# Patient Record
Sex: Female | Born: 1970 | Hispanic: No | Marital: Single | Smoking: Never smoker
Health system: Southern US, Community
[De-identification: ages and names within clinical notes are randomized; demographics above are authoritative.]

## PROBLEM LIST (undated history)

## (undated) DIAGNOSIS — I341 Nonrheumatic mitral (valve) prolapse: Secondary | ICD-10-CM

## (undated) DIAGNOSIS — F411 Generalized anxiety disorder: Secondary | ICD-10-CM

## (undated) DIAGNOSIS — N6019 Diffuse cystic mastopathy of unspecified breast: Secondary | ICD-10-CM

## (undated) DIAGNOSIS — J302 Other seasonal allergic rhinitis: Secondary | ICD-10-CM

## (undated) DIAGNOSIS — M419 Scoliosis, unspecified: Secondary | ICD-10-CM

## (undated) HISTORY — PX: WISDOM TOOTH EXTRACTION: SHX21

## (undated) HISTORY — DX: Other seasonal allergic rhinitis: J30.2

## (undated) HISTORY — DX: Scoliosis, unspecified: M41.9

## (undated) HISTORY — DX: Diffuse cystic mastopathy of unspecified breast: N60.19

## (undated) HISTORY — DX: Generalized anxiety disorder: F41.1

---

## 1997-07-18 ENCOUNTER — Other Ambulatory Visit: Admission: RE | Admit: 1997-07-18 | Discharge: 1997-07-18 | Payer: Self-pay | Admitting: *Deleted

## 1998-05-15 ENCOUNTER — Other Ambulatory Visit: Admission: RE | Admit: 1998-05-15 | Discharge: 1998-05-15 | Payer: Self-pay | Admitting: Obstetrics and Gynecology

## 2000-05-26 ENCOUNTER — Other Ambulatory Visit: Admission: RE | Admit: 2000-05-26 | Discharge: 2000-05-26 | Payer: Self-pay | Admitting: Obstetrics and Gynecology

## 2001-06-21 ENCOUNTER — Other Ambulatory Visit: Admission: RE | Admit: 2001-06-21 | Discharge: 2001-06-21 | Payer: Self-pay | Admitting: Obstetrics and Gynecology

## 2003-03-11 ENCOUNTER — Encounter: Admission: RE | Admit: 2003-03-11 | Discharge: 2003-03-11 | Payer: Self-pay | Admitting: *Deleted

## 2004-04-20 ENCOUNTER — Encounter: Admission: RE | Admit: 2004-04-20 | Discharge: 2004-04-20 | Payer: Self-pay | Admitting: Obstetrics and Gynecology

## 2004-11-19 ENCOUNTER — Encounter: Admission: RE | Admit: 2004-11-19 | Discharge: 2004-11-19 | Payer: Self-pay | Admitting: Obstetrics and Gynecology

## 2007-04-06 ENCOUNTER — Encounter: Admission: RE | Admit: 2007-04-06 | Discharge: 2007-04-06 | Payer: Self-pay | Admitting: Obstetrics

## 2008-02-21 ENCOUNTER — Encounter: Admission: RE | Admit: 2008-02-21 | Discharge: 2008-02-21 | Payer: Self-pay | Admitting: Obstetrics

## 2008-08-07 ENCOUNTER — Encounter: Admission: RE | Admit: 2008-08-07 | Discharge: 2008-08-07 | Payer: Self-pay | Admitting: Obstetrics

## 2009-02-26 ENCOUNTER — Encounter: Admission: RE | Admit: 2009-02-26 | Discharge: 2009-02-26 | Payer: Self-pay | Admitting: Obstetrics

## 2009-06-23 ENCOUNTER — Encounter: Admission: RE | Admit: 2009-06-23 | Discharge: 2009-06-23 | Payer: Self-pay | Admitting: Obstetrics

## 2009-07-22 ENCOUNTER — Encounter: Admission: RE | Admit: 2009-07-22 | Discharge: 2009-07-22 | Payer: Self-pay | Admitting: Obstetrics

## 2009-07-22 HISTORY — PX: BREAST CYST ASPIRATION: SHX578

## 2009-12-25 ENCOUNTER — Encounter
Admission: RE | Admit: 2009-12-25 | Discharge: 2009-12-25 | Payer: Self-pay | Source: Home / Self Care | Attending: Obstetrics | Admitting: Obstetrics

## 2009-12-25 HISTORY — PX: BREAST CYST ASPIRATION: SHX578

## 2010-08-09 ENCOUNTER — Other Ambulatory Visit: Payer: Self-pay | Admitting: Obstetrics

## 2010-08-09 ENCOUNTER — Other Ambulatory Visit: Payer: Self-pay | Admitting: Obstetrics and Gynecology

## 2010-08-09 DIAGNOSIS — N644 Mastodynia: Secondary | ICD-10-CM

## 2010-08-09 DIAGNOSIS — N6009 Solitary cyst of unspecified breast: Secondary | ICD-10-CM

## 2010-08-16 ENCOUNTER — Ambulatory Visit
Admission: RE | Admit: 2010-08-16 | Discharge: 2010-08-16 | Disposition: A | Discharge status: Home / Self Care | Payer: Commercial Managed Care - PPO | Source: Ambulatory Visit | Attending: Obstetrics | Admitting: Obstetrics

## 2010-08-16 ENCOUNTER — Other Ambulatory Visit: Payer: Self-pay | Admitting: Obstetrics

## 2010-08-16 DIAGNOSIS — N6009 Solitary cyst of unspecified breast: Secondary | ICD-10-CM

## 2010-08-16 DIAGNOSIS — N644 Mastodynia: Secondary | ICD-10-CM

## 2010-08-16 HISTORY — PX: BREAST CYST ASPIRATION: SHX578

## 2011-03-18 ENCOUNTER — Ambulatory Visit (INDEPENDENT_AMBULATORY_CARE_PROVIDER_SITE_OTHER): Payer: Commercial Indemnity | Admitting: Surgery

## 2011-03-18 ENCOUNTER — Encounter (INDEPENDENT_AMBULATORY_CARE_PROVIDER_SITE_OTHER): Payer: Self-pay | Admitting: Surgery

## 2011-03-18 VITALS — BP 102/74 | HR 76 | Temp 98.6°F | Resp 18 | Ht 65.0 in | Wt 133.4 lb

## 2011-03-18 DIAGNOSIS — L02214 Cutaneous abscess of groin: Secondary | ICD-10-CM

## 2011-03-18 DIAGNOSIS — L03319 Cellulitis of trunk, unspecified: Secondary | ICD-10-CM

## 2011-03-18 DIAGNOSIS — L02219 Cutaneous abscess of trunk, unspecified: Secondary | ICD-10-CM

## 2011-03-18 NOTE — Progress Notes (Signed)
[facility]  Ovidio Kin, MD,  FACS [address]  [address] [place], [place]    [address] Phone:  [phone] FAX:  [phone]   Re:   Cynthia Nguyen DOB:   [birth year] MRN:   [id number]  ASSESSMENT AND PLAN: 1.  Left Groin abscess.  She had infection there 20 years ago.  Has had recurrent drainage for about 4 months.  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  Plan excision under local anesthesia.  Indications and risks explained to the patient. (note: she has a mole beside the cyst that I will also excise.)  2.  Scoliosis. 3.  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  HISTORY OF PRESENT ILLNESS: Chief Complaint  Patient presents with  . Cyst    lt groin    Cynthia Nguyen is a 41 y.o. (DOB: September 29, 1970)  white female who is a patient of Elie Confer, MD, MD and comes to me today for groin abscess.  Referred by Dr. Darrold Span.  Works as a Education officer, community.  She had infection there 20 years ago.  Has had recurrent drainage for about 4 months.  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  Has tried some antibiotics which has helped some.  Allergies:  Cephalexin PMHx:  Scoliosis.  MVP  Social Hx:  Cynthia Nguyen with her.  She works as a Education officer, community.  PHYSICAL EXAM: BP 102/74  Pulse 76  Temp(Src) 98.6 F (37 C) (Oral)  Resp 18  Ht 5\' 5"  (1.651 m)  Wt 133 lb 6 oz (60.499 kg)  BMI 22.19 kg/m2  Left groin - 1 cm draining cyst.  Small mole just beside this that I would excise at the same time.  DATA REVIEWED: Notes from Dr. Milus Banister, MD, FACS Office:  [phone]

## 2011-03-21 ENCOUNTER — Telehealth (INDEPENDENT_AMBULATORY_CARE_PROVIDER_SITE_OTHER): Payer: Self-pay

## 2011-03-21 NOTE — Telephone Encounter (Signed)
We received a voice message from the pt that the cyst has gone away and looks it better.  She wants to know if it's possible for the cyst to completely go away.  She can't have surgery this Friday due to her work schedule and would like to postpone a few weeks.  I left her a message back that I don't think the cyst  goes away but the inflammation and infection does and there is still a cyst sac that needs removing.  Eunice Blase will talk to her about rescheduling.

## 2011-03-22 ENCOUNTER — Telehealth (INDEPENDENT_AMBULATORY_CARE_PROVIDER_SITE_OTHER): Payer: Self-pay

## 2011-03-22 NOTE — Telephone Encounter (Signed)
Cynthia [facility] from [facility] called. No labs or tests were ordered on orders sheet that was axed over. If patient needs please fax them over

## 2011-03-23 ENCOUNTER — Telehealth (INDEPENDENT_AMBULATORY_CARE_PROVIDER_SITE_OTHER): Payer: Self-pay | Admitting: General Surgery

## 2011-03-23 NOTE — Telephone Encounter (Signed)
Called Brenda at the [facility] to let them know that Dr Ezzard Standing does not need any pre op labs on this pt day of surgery

## 2011-09-08 ENCOUNTER — Other Ambulatory Visit: Payer: Self-pay | Admitting: Obstetrics

## 2011-09-08 DIAGNOSIS — Z1231 Encounter for screening mammogram for malignant neoplasm of breast: Secondary | ICD-10-CM

## 2011-09-26 ENCOUNTER — Ambulatory Visit
Admission: RE | Admit: 2011-09-26 | Discharge: 2011-09-26 | Disposition: A | Discharge status: Home / Self Care | Payer: Commercial Indemnity | Source: Ambulatory Visit | Attending: Obstetrics | Admitting: Obstetrics

## 2011-09-26 DIAGNOSIS — Z1231 Encounter for screening mammogram for malignant neoplasm of breast: Secondary | ICD-10-CM

## 2012-08-13 ENCOUNTER — Other Ambulatory Visit: Payer: Self-pay

## 2012-08-13 DIAGNOSIS — Z1231 Encounter for screening mammogram for malignant neoplasm of breast: Secondary | ICD-10-CM

## 2012-10-08 ENCOUNTER — Ambulatory Visit: Payer: Commercial Indemnity

## 2012-10-08 ENCOUNTER — Other Ambulatory Visit: Payer: Self-pay | Admitting: Obstetrics

## 2012-10-08 DIAGNOSIS — N6009 Solitary cyst of unspecified breast: Secondary | ICD-10-CM

## 2012-10-26 ENCOUNTER — Ambulatory Visit
Admission: RE | Admit: 2012-10-26 | Discharge: 2012-10-26 | Disposition: A | Discharge status: Home / Self Care | Payer: Managed Care, Other (non HMO) | Source: Ambulatory Visit | Attending: Obstetrics | Admitting: Obstetrics

## 2012-10-26 DIAGNOSIS — N6009 Solitary cyst of unspecified breast: Secondary | ICD-10-CM

## 2012-10-29 ENCOUNTER — Encounter: Payer: Self-pay | Admitting: Podiatry

## 2012-10-29 ENCOUNTER — Ambulatory Visit (INDEPENDENT_AMBULATORY_CARE_PROVIDER_SITE_OTHER): Payer: Managed Care, Other (non HMO) | Admitting: Podiatry

## 2012-10-29 VITALS — BP 117/82 | HR 84 | Resp 12 | Ht 65.0 in | Wt 136.0 lb

## 2012-10-29 DIAGNOSIS — B351 Tinea unguium: Secondary | ICD-10-CM

## 2012-10-29 DIAGNOSIS — M7750 Other enthesopathy of unspecified foot: Secondary | ICD-10-CM

## 2012-10-29 MED ORDER — TERBINAFINE HCL 250 MG PO TABS: 250.0000 mg | ORAL_TABLET | Freq: Every day | ORAL | Status: DC

## 2012-10-29 NOTE — Progress Notes (Signed)
Subjective:     Patient ID: Cynthia Nguyen, female   DOB: 1970-03-04, 42 y.o.   MRN: [id number]  HPI patient states I am doing okay but the fungus on my toenail seems worse. States that she likes her orthotics and is walking well with   Review of Systems  All other systems reviewed and are negative.       Objective:   Physical Exam  Nursing note and vitals reviewed. Cardiovascular: Intact distal pulses.   Musculoskeletal: Normal range of motion.  Neurological: She is alert.  Skin: Skin is warm.   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

## 2012-10-29 NOTE — Patient Instructions (Signed)
Continue topical and begin 30 days of oral

## 2012-12-10 ENCOUNTER — Encounter: Payer: Self-pay | Admitting: Podiatry

## 2012-12-10 ENCOUNTER — Ambulatory Visit: Payer: Managed Care, Other (non HMO) | Admitting: Podiatry

## 2012-12-10 VITALS — BP 110/75 | HR 81 | Resp 16

## 2012-12-10 DIAGNOSIS — B351 Tinea unguium: Secondary | ICD-10-CM

## 2012-12-10 NOTE — Progress Notes (Signed)
Subjective:     Patient ID: Cynthia Nguyen, female   DOB: 1970/12/04, 42 y.o.   MRN: [id number]  HPI patient states 17 the nail seems improved and one is the same   Review of Systems     Objective:   Physical Exam Nail disease right hallux with gradual improvement with laser and topical    Assessment:     Laser applied today to nailed   bed    Plan:     Reappoint her recheck in formalin

## 2013-01-11 ENCOUNTER — Encounter: Payer: Self-pay | Admitting: Cardiology

## 2013-01-11 ENCOUNTER — Ambulatory Visit (INDEPENDENT_AMBULATORY_CARE_PROVIDER_SITE_OTHER): Payer: Commercial Indemnity | Admitting: Cardiology

## 2013-01-11 VITALS — BP 117/76 | HR 81 | Ht 65.0 in | Wt 138.0 lb

## 2013-01-11 DIAGNOSIS — Z8249 Family history of ischemic heart disease and other diseases of the circulatory system: Secondary | ICD-10-CM

## 2013-01-11 DIAGNOSIS — R002 Palpitations: Secondary | ICD-10-CM

## 2013-01-11 NOTE — Progress Notes (Signed)
HPI The patient presents for evaluation of palpitations.  She has a past history of tachycardia treated age 43 with beta blockers. She came off of this in her mid 81s. Her father was recently diagnosed with atrial fibrillation. She has felt some palpitations over time. These are sporadic but relatively frequent. She feels a skipping but she doesn't describe any sustained tachycardia arrhythmias. She doesn't have any presyncope or syncope with it. It is at rest and not necessarily with activities. She wonders if it keeps her awake at night. She has had difficulty sleeping at times has taken Xanax for this. She is active at work but not exercising. With her activity she does not have any chest pressure, neck or arm discomfort. She does have any shortness of breath, PND or orthopnea. He does have a weight gain or edema.  Of note she does report palpitations are more significant when she is having some alcohol.   Allergies  Allergen Reactions  . Cephalexin     dizziness    Current Outpatient Prescriptions  Medication Sig Dispense Refill  . ALPRAZolam (XANAX) 0.25 MG tablet Take by mouth as needed.       . Cholecalciferol (VITAMIN D PO) Take by mouth.      . clobetasol cream (TEMOVATE) 5.46 % Apply 1 application topically as needed.       No current facility-administered medications for this visit.    No past medical history on file.  Past Surgical History  Procedure Laterality Date  . Wisdom tooth extraction      History reviewed. No pertinent family history.  History   Social History  . Marital Status: Single    Spouse Name: N/A    Number of Children: N/A  . Years of Education: N/A   Occupational History  . Not on file.   Social History Main Topics  . Smoking status: Never Smoker   . Smokeless tobacco: Not on file  . Alcohol Use: 0.5 oz/week    1 drink(s) per week  . Drug Use: No  . Sexual Activity: Not on file   Other Topics Concern  . Not on file   Social History  Narrative  . No narrative on file    ROS:  As stated in the HPI and negative for all other systems.  PHYSICAL EXAM BP 117/76  Pulse 81  Ht 5\' 5"  (1.651 m)  Wt 138 lb (62.596 kg)  BMI 22.96 kg/m2 GENERAL:  Well appearing HEENT:  Pupils equal round and reactive, fundi not visualized, oral mucosa unremarkable NECK:  No jugular venous distention, waveform within normal limits, carotid upstroke brisk and symmetric, no bruits, no thyromegaly LYMPHATICS:  No cervical, inguinal adenopathy LUNGS:  Clear to auscultation bilaterally BACK:  No CVA tenderness CHEST:  Unremarkable HEART:  PMI not displaced or sustained,S1 and S2 within normal limits, no S3, no S4, no clicks, no rubs, no murmurs ABD:  Flat, positive bowel sounds normal in frequency in pitch, no bruits, no rebound, no guarding, no midline pulsatile mass, no hepatomegaly, no splenomegaly EXT:  2 plus pulses throughout, no edema, no cyanosis no clubbing SKIN:  No rashes no nodules NEURO:  Cranial nerves II through XII grossly intact, motor grossly intact throughout PSYCH:  Cognitively intact, oriented to person place and time   EKG:  Sinus rhythm, rate 74, axis within normal limits, intervals within normal limits, no acute ST-T wave changes.  01/11/2013   ASSESSMENT AND PLAN  PALPITATIONS:  I will check a 48  hour Holter.  I will also check  TSH.  Further treatment will be based on these results.

## 2013-01-11 NOTE — Patient Instructions (Signed)
The current medical regimen is effective;  continue present plan and medications.  Your physician has recommended that you wear a holter monitor for 48 hours. Holter monitors are medical devices that record the heart's electrical activity. Doctors most often use these monitors to diagnose arrhythmias. Arrhythmias are problems with the speed or rhythm of the heartbeat. The monitor is a small, portable device. You can wear one while you do your normal daily activities. This is usually used to diagnose what is causing palpitations/syncope (passing out).  Please have TSH when you return for your holter placement.  Further follow up will be based on these results.

## 2013-01-12 DIAGNOSIS — R002 Palpitations: Secondary | ICD-10-CM | POA: Insufficient documentation

## 2013-01-18 ENCOUNTER — Other Ambulatory Visit (INDEPENDENT_AMBULATORY_CARE_PROVIDER_SITE_OTHER): Payer: Commercial Indemnity

## 2013-01-18 ENCOUNTER — Encounter: Payer: Self-pay | Admitting: Radiology

## 2013-01-18 ENCOUNTER — Encounter (INDEPENDENT_AMBULATORY_CARE_PROVIDER_SITE_OTHER): Payer: Commercial Indemnity

## 2013-01-18 DIAGNOSIS — R002 Palpitations: Secondary | ICD-10-CM

## 2013-01-18 DIAGNOSIS — Z8249 Family history of ischemic heart disease and other diseases of the circulatory system: Secondary | ICD-10-CM

## 2013-01-18 LAB — TSH: TSH: 0.99 u[IU]/mL (ref 0.35–5.50)

## 2013-01-18 NOTE — Progress Notes (Signed)
Patient ID: Cynthia Nguyen, female   DOB: 01/31/1970, 43 y.o.   MRN: [id number] E Cardio 24hr holter monitor applied

## 2013-03-05 ENCOUNTER — Other Ambulatory Visit: Payer: Self-pay | Admitting: Family Medicine

## 2013-03-05 DIAGNOSIS — R109 Unspecified abdominal pain: Secondary | ICD-10-CM

## 2013-03-12 ENCOUNTER — Ambulatory Visit
Admission: RE | Admit: 2013-03-12 | Discharge: 2013-03-12 | Disposition: A | Discharge status: Home / Self Care | Payer: Managed Care, Other (non HMO) | Source: Ambulatory Visit | Attending: Family Medicine | Admitting: Family Medicine

## 2013-03-12 DIAGNOSIS — R109 Unspecified abdominal pain: Secondary | ICD-10-CM

## 2013-03-18 ENCOUNTER — Telehealth: Payer: Self-pay | Admitting: *Deleted

## 2013-03-18 NOTE — Telephone Encounter (Signed)
Patient called, stated that she has an appointment scheduled for 04/08/13 at 8am for fungal toenails.  She stated she noticed a little improvement from the laser treatment at her last appointment but very minimal.  Now the nails have gotten worse.  She stated she's very frustrated because she's been very compliant.  She wants to know if there's any other options that can be performed.

## 2013-03-19 NOTE — Telephone Encounter (Signed)
I informed Rise Paganini of Dr. Mellody Drown response.  She was very adamant about getting further information about what other treatment options were available.  She stated why spend more money if there's not any further information that can be given.  We want a plan in place.  Our time is valuable.  We are really frustrated with this whole process.  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  They are about to fall off.  So we want a plan on what's next.  Has a sample been taken?  Do we need to go forward with that?  Please advise as soon as possible.  We'll look forward to his response.

## 2013-03-19 NOTE — Telephone Encounter (Signed)
Keep her appointment and I will evaluate

## 2013-03-20 ENCOUNTER — Encounter: Payer: Self-pay | Admitting: Internal Medicine

## 2013-03-20 NOTE — Telephone Encounter (Signed)
I informed Cynthia Nguyen that I spoke with Dr. Paulla Dolly.  He wants to re-evaluate her, take a sample of her nails for a fungal culture, perform another laser treatment and may prescribe an oral medication to treat the fungus.  She stated she would let the patient know.  They will see him at next scheduled visit.

## 2013-03-21 ENCOUNTER — Encounter: Payer: Self-pay | Admitting: *Deleted

## 2013-04-08 ENCOUNTER — Ambulatory Visit: Payer: Managed Care, Other (non HMO) | Admitting: Podiatry

## 2013-04-22 ENCOUNTER — Ambulatory Visit: Payer: Managed Care, Other (non HMO) | Admitting: Podiatry

## 2013-04-29 ENCOUNTER — Encounter: Payer: Self-pay | Admitting: Podiatry

## 2013-04-29 ENCOUNTER — Ambulatory Visit: Payer: Managed Care, Other (non HMO) | Admitting: Podiatry

## 2013-04-29 VITALS — BP 113/74 | HR 81 | Resp 16

## 2013-04-29 DIAGNOSIS — B351 Tinea unguium: Secondary | ICD-10-CM

## 2013-04-29 NOTE — Progress Notes (Signed)
Subjective:     Patient ID: Cynthia Nguyen, female   DOB: May 22, 1970, 43 y.o.   MRN: [id number]  HPI my right big toe seems improved   Review of Systems     Objective:   Physical Exam Nail bed is improved from previous visit    Assessment:     Improve mycosis right hallux nail    Plan:     Laser #3 approximate thousand shocks to the right nailbed which was tolerated well and patient's discharged and less any issues should occur

## 2013-05-24 ENCOUNTER — Encounter: Payer: Self-pay | Admitting: Internal Medicine

## 2013-05-24 ENCOUNTER — Ambulatory Visit (INDEPENDENT_AMBULATORY_CARE_PROVIDER_SITE_OTHER): Payer: Managed Care, Other (non HMO) | Admitting: Internal Medicine

## 2013-05-24 VITALS — BP 90/64 | HR 68 | Ht 65.0 in | Wt 135.0 lb

## 2013-05-24 DIAGNOSIS — Z800 Family history of malignant neoplasm of digestive organs: Secondary | ICD-10-CM

## 2013-05-24 DIAGNOSIS — R1012 Left upper quadrant pain: Secondary | ICD-10-CM

## 2013-05-24 MED ORDER — MOVIPREP 100 G PO SOLR: 1.0000 | Freq: Once | ORAL | Status: DC

## 2013-05-24 NOTE — Patient Instructions (Signed)
You have been scheduled for a colonoscopy with propofol. Please follow written instructions given to you at your visit today.  Please pick up your prep kit at the pharmacy within the next 1-3 days. If you use inhalers (even only as needed), please bring them with you on the day of your procedure. Your physician has requested that you go to www.startemmi.com and enter the access code given to you at your visit today. This web site gives a general overview about your procedure. However, you should still follow specific instructions given to you by our office regarding your preparation for the procedure.  Please purchase the following medications over the counter and take as directed: Benefiber 1 heaping teaspoon daily  CC:Dr Carol Webb 

## 2013-05-24 NOTE — Progress Notes (Signed)
Cynthia Nguyen [birth year] [id number]  Note: This dictation was prepared with Dragon digital system. Any transcriptional errors that result from this procedure are unintentional.   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 She has been having intermittent low-grade abdominal discomfort in the left upper quadrant and left middle quadrant. It is localized anteriorly. It does not radiate to the epigastrium or to her back. She notices it when she sits on the edge of the bed but it is not associated with bowel movements or eating. She has regular bowel habits  On daily basis.  An upper abdominal ultrasound last month was negative,  common bile duct measuring 5.4 mm. She did have mild fatty liver. She has a history of irritable bowel syndrome diagnosed when she was in college,  her symptoms c/o frequent movements postprandially consistent with spastic colon. At the age of 23, she underwent a flexible sigmoidoscopy by Dr Lajoyce Corners and subsequently had a full  colonoscopy by Dr.Mary Shearrin, both of which were normal. She has a family history of colon polyps in her mother and colon cancer in her father who  passed away last week. Her father's sister also had colon cancer ( at age 22) . Patient is quite concerned about her symptoms and would like to have a colonoscopy. She had a CT scan of the abdomen in October 2004 which was normal.     Past Medical History  Diagnosis Date  . Scoliosis   . Fibrocystic breast disease   . Seasonal allergies   . Generalized anxiety disorder     Past Surgical History  Procedure Laterality Date  . Wisdom tooth extraction      Allergies  Allergen Reactions  . Cephalexin     dizziness    Family history and social history have been reviewed.  Review of Systems: Denies heartburn dysphagia food intolerance, positive for weight loss of 10 pounds in the last several months as a result of stress  The remainder of the 10 point  ROS is negative except as outlined in the H&P  Physical Exam: General Appearance Well developed, in no distress Eyes  Non icteric  HEENT  Non traumatic, normocephalic  Mouth No lesion, tongue papillated, no cheilosis Neck Supple without adenopathy, thyroid not enlarged, no carotid bruits, no JVD Lungs Clear to auscultation bilaterally COR Normal S1, normal S2, regular rhythm, no murmur, quiet precordium Abdomen Soft with minimal discomfort in the left upper and left middle quadrant. No pulsations. No mass. Active bowel sounds. No CVA tenderness  Rectal Small amount of formed Hemoccult-negative stool in the rectum  Extremities  No pedal edema Skin No lesions Neurological Alert and oriented x 3 Psychological Normal mood and affect  Assessment and Plan:   Problem #1 Intermittent mild abdominal discomfort suggestive of irritable bowel syndrome. She was having similar symptoms when she was in college and again about 10 years ago. A prior colonoscopy was negative. Differential diagnosis includes diverticulosis, endometriosis or segmental colitis. I have suggested she take Benefiber one heaping teaspoon daily and offered antispasmodics to take on an as necessary basis. She is interested in a colonoscopy because of her family history of colon polyps and colon cancer on both sides of the family. Since she is 43 years old, we will proceed with a colonoscopy at this time.    Lafayette Dragon 05/24/2013

## 2013-05-30 ENCOUNTER — Telehealth: Payer: Self-pay | Admitting: *Deleted

## 2013-05-30 NOTE — Telephone Encounter (Signed)
There weeks ago to get last laser treatment.  My toe has really gone down since that last time.  I don't know what to do next.  Does he have any suggestions?  Please give me a call.

## 2013-05-31 NOTE — Telephone Encounter (Signed)
After I got the laser the whole right side of the nail is turning yellow.   The skin underneath looked purplish then the toenail turned yellow.  It didn't take long after I had the laser.  It has started to loosen up where it has turned yellow.  The yellow covers about half of the nail now.  What does he suggest I do now?  I told her I would call with a response on Monday.

## 2013-05-31 NOTE — Telephone Encounter (Signed)
Left a message to call me back with more detail about what's going on with your toenails.

## 2013-06-03 NOTE — Telephone Encounter (Signed)
I attempted to return her call.  Her voicemail would not allow me to leave a message.  I was going to inform her that Dr. Paulla Dolly said there may have been trauma.  The nail should grow out but will take time.  He said he can prescribe an oral medication if she would like to try it.

## 2013-06-04 ENCOUNTER — Encounter: Payer: Self-pay | Admitting: Internal Medicine

## 2013-06-05 NOTE — Telephone Encounter (Signed)
I attempted to call the patient again to give her Dr. Mellody Drown response.  I got her voicemail but was unable to leave a message because her box was full.

## 2013-06-06 NOTE — Telephone Encounter (Signed)
I left a message to call our office for advise and Dr. Mellody Drown recommendation.

## 2013-06-07 ENCOUNTER — Telehealth: Payer: Self-pay

## 2013-06-07 NOTE — Telephone Encounter (Signed)
Returned pt call regarding worsening nail problem. Left a voicemail that Dr. Paulla Dolly stated it was probably due to injury/trauma and the nail would grow out. He would prescribe her some oral mediciation if she wanted but it was nothing that was an emergency. Advise to continue to monitor nail and advised on s/s of infection and fever

## 2013-06-10 NOTE — Telephone Encounter (Signed)
I called earlier today still haven't received a response.  I been waiting about a week now to hear something from Dr. Paulla Dolly regarding my toe.

## 2013-06-10 NOTE — Telephone Encounter (Signed)
I attempted to return her call, I left her a message to call me back.

## 2013-06-12 MED ORDER — EFINACONAZOLE 10 % EX SOLN: 1.0000 "application " | Freq: Every day | CUTANEOUS | Status: DC

## 2013-06-12 NOTE — Telephone Encounter (Signed)
I called and left her a message that Dr. Paulla Dolly stated the problem may be due to trauma.  The nail should grow out but it will take time.  He said he can prescribe you an oral medication if you would like.  Please give me a call back.

## 2013-06-12 NOTE — Telephone Encounter (Signed)
Patient returned my call.  She stated there has not been any trauma to her nail.  No one has stepped on the toe nor have I dropped anything on it.  It is fungus.  I don't want to take an oral medication.  I just want him to prescribe me another topical.  This Formula 3 is not working.  I told her we can try Jublia.  She stated that is fine.  I told her to expect to hear from a company called [employer], they will call to verify insurance information.  She stated that is fine.

## 2013-06-13 NOTE — Telephone Encounter (Signed)
Fine for the jublia

## 2013-07-26 ENCOUNTER — Ambulatory Visit (AMBULATORY_SURGERY_CENTER): Payer: Managed Care, Other (non HMO) | Admitting: Internal Medicine

## 2013-07-26 ENCOUNTER — Encounter: Payer: Self-pay | Admitting: Internal Medicine

## 2013-07-26 VITALS — BP 107/69 | HR 64 | Temp 98.0°F | Resp 14 | Ht 65.0 in | Wt 135.0 lb

## 2013-07-26 DIAGNOSIS — Z1211 Encounter for screening for malignant neoplasm of colon: Secondary | ICD-10-CM

## 2013-07-26 DIAGNOSIS — D126 Benign neoplasm of colon, unspecified: Secondary | ICD-10-CM

## 2013-07-26 DIAGNOSIS — Z800 Family history of malignant neoplasm of digestive organs: Secondary | ICD-10-CM

## 2013-07-26 MED ORDER — SODIUM CHLORIDE 0.9 % IV SOLN: 500.0000 mL | INTRAVENOUS | Status: DC

## 2013-07-26 NOTE — Progress Notes (Signed)
Procedure ends, to recovery, report given and VSS. 

## 2013-07-26 NOTE — Progress Notes (Signed)
Called to room to assist during endoscopic procedure.  Patient ID and intended procedure confirmed with present staff. Received instructions for my participation in the procedure from the performing physician.  

## 2013-07-26 NOTE — Patient Instructions (Signed)
YOU HAD AN ENDOSCOPIC PROCEDURE TODAY AT THE [facility]: Refer to the procedure report that was given to you for any specific questions about what was found during the examination.  If the procedure report does not answer your questions, please call your gastroenterologist to clarify.  If you requested that your care partner not be given the details of your procedure findings, then the procedure report has been included in a sealed envelope for you to review at your convenience later.  YOU SHOULD EXPECT: Some feelings of bloating in the abdomen. Passage of more gas than usual.  Walking can help get rid of the air that was put into your GI tract during the procedure and reduce the bloating. If you had a lower endoscopy (such as a colonoscopy or flexible sigmoidoscopy) you may notice spotting of blood in your stool or on the toilet paper. If you underwent a bowel prep for your procedure, then you may not have a normal bowel movement for a few days.  DIET: Your first meal following the procedure should be a light meal and then it is ok to progress to your normal diet.  A half-sandwich or bowl of soup is an example of a good first meal.  Heavy or fried foods are harder to digest and may make you feel nauseous or bloated.  Likewise meals heavy in dairy and vegetables can cause extra gas to form and this can also increase the bloating.  Drink plenty of fluids but you should avoid alcoholic beverages for 24 hours.  ACTIVITY: Your care partner should take you home directly after the procedure.  You should plan to take it easy, moving slowly for the rest of the day.  You can resume normal activity the day after the procedure however you should NOT DRIVE or use heavy machinery for 24 hours (because of the sedation medicines used during the test).    SYMPTOMS TO REPORT IMMEDIATELY: A gastroenterologist can be reached at any hour.  During normal business hours, 8:30 AM to 5:00 PM Monday through Friday,  call [phone].  After hours and on weekends, please call the GI answering service at [phone] who will take a message and have the physician on call contact you.   Following lower endoscopy (colonoscopy or flexible sigmoidoscopy):  Excessive amounts of blood in the stool  Significant tenderness or worsening of abdominal pains  Swelling of the abdomen that is new, acute  Fever of 100F or higher  FOLLOW UP: If any biopsies were taken you will be contacted by phone or by letter within the next 1-3 weeks.  Call your gastroenterologist if you have not heard about the biopsies in 3 weeks.  Our staff will call the home number listed on your records the next business day following your procedure to check on you and address any questions or concerns that you may have at that time regarding the information given to you following your procedure. This is a courtesy call and so if there is no answer at the home number and we have not heard from you through the emergency physician on call, we will assume that you have returned to your regular daily activities without incident.  SIGNATURES/CONFIDENTIALITY: You and/or your care partner have signed paperwork which will be entered into your electronic medical record.  These signatures attest to the fact that that the information above on your After Visit Summary has been reviewed and is understood.  Full responsibility of the confidentiality of this   discharge information lies with you and/or your care-partner.  High fiber diet information given.  Await biopsy results.  Recall colonoscopy 5 years-2020.

## 2013-07-26 NOTE — Op Note (Signed)
[place]  [address] [place], [id number]   COLONOSCOPY PROCEDURE REPORT  PATIENT: Cynthia, Nguyen  MR#: [id number] BIRTHDATE: 1970-06-06 , 43  yrs. old GENDER: Female ENDOSCOPIST: Lafayette Dragon, MD REFERRED BY:Dr Maurice Small PROCEDURE DATE:  07/26/2013 PROCEDURE:   Colonoscopy with biopsy First Screening Colonoscopy - Avg.  risk and is 50 yrs.  old or older - No.  Prior Negative Screening - Now for repeat screening. Above average risk  History of Adenoma - Now for follow-up colonoscopy & has been > or = to 3 yrs.  N/A  Polyps Removed Today? No.  Recommend repeat exam, <10 yrs? Yes.  High risk (family or personal hx). ASA CLASS:   Class I INDICATIONS:ascitic family history of colon cancer in patient's father and father's sister.  Positive family history of colon polyp in patient's mother.  Last colonoscopy more than 5 years ago by Dr. Jerilynn Mages.[place]. MEDICATIONS: MAC sedation, administered by CRNA and Propofol (Diprivan) 270 mg IV  DESCRIPTION OF PROCEDURE:   After the risks benefits and alternatives of the procedure were thoroughly explained, informed consent was obtained.  A digital rectal exam revealed no abnormalities of the rectum.   The     endoscope was introduced through the anus and advanced to the cecum, which was identified by both the appendix and ileocecal valve. No adverse events experienced.   The quality of the prep was excellent, using MoviPrep  The instrument was then slowly withdrawn as the colon was fully examined.      COLON FINDINGS: Patches ofbnormal mucosa was found in the sigmoid colon. there were consistent with either focal colitis or with the scope induced trauma The mucosa was erythematous and had granularity.  Multiple biopsies were performed using cold forceps. Retroflexed views revealed no abnormalities. The time to cecum=8 minutes 32 seconds.  Withdrawal time=8 minutes 32 seconds.  The scope was withdrawn and  the procedure completed. COMPLICATIONS: There were no complications.  ENDOSCOPIC IMPRESSION: Abnormal mucosa was found in the sigmoid colon; multiple biopsies were performed using cold forceps ,rule out focal colitis versus scope induced trauma  RECOMMENDATIONS: 1.  Await biopsy results 2.  high fiber diet Recall colonoscopy in 5 years   eSigned:  Lafayette Dragon, MD 07/26/2013 8:36 AM   cc:   PATIENT NAME:  Cynthia, Nguyen MR#: [id number]

## 2013-07-29 ENCOUNTER — Telehealth: Payer: Self-pay | Admitting: *Deleted

## 2013-07-29 NOTE — Telephone Encounter (Signed)
No answer, message left for the patient. 

## 2013-07-31 ENCOUNTER — Encounter: Payer: Self-pay | Admitting: Internal Medicine

## 2013-09-23 ENCOUNTER — Telehealth: Payer: Self-pay | Admitting: Internal Medicine

## 2013-09-23 MED ORDER — METRONIDAZOLE 250 MG PO TABS: ORAL_TABLET | ORAL | Status: DC

## 2013-09-23 MED ORDER — DICYCLOMINE HCL 10 MG PO CAPS: ORAL_CAPSULE | ORAL | Status: DC

## 2013-09-23 NOTE — Telephone Encounter (Signed)
Please start Bentyl 10 mg po bid,#30 x 2 weeks and Flagyl 250 mg, #21, 1 po tid x 1 week for bacterial overgrowth. If not better, I will see her in the office.

## 2013-09-23 NOTE — Telephone Encounter (Signed)
Spoke with patient and she states she has had a few episodes of IBS like symptoms since her colonoscopy. She is calling to report a new symptom of left lower abdominal pressure pain like cramps that is there all the time. She report she is taking Benefiber when she can remember it. Please, advise.

## 2013-09-23 NOTE — Telephone Encounter (Signed)
Rx's sent. Patient notified of recommendations.

## 2013-10-14 ENCOUNTER — Other Ambulatory Visit: Payer: Self-pay

## 2013-10-14 DIAGNOSIS — Z1231 Encounter for screening mammogram for malignant neoplasm of breast: Secondary | ICD-10-CM

## 2013-11-04 ENCOUNTER — Ambulatory Visit
Admission: RE | Admit: 2013-11-04 | Discharge: 2013-11-04 | Disposition: A | Discharge status: Home / Self Care | Payer: Managed Care, Other (non HMO) | Source: Ambulatory Visit

## 2013-11-04 DIAGNOSIS — Z1231 Encounter for screening mammogram for malignant neoplasm of breast: Secondary | ICD-10-CM

## 2014-03-04 ENCOUNTER — Other Ambulatory Visit: Payer: Self-pay | Admitting: Family Medicine

## 2014-03-04 DIAGNOSIS — R1084 Generalized abdominal pain: Secondary | ICD-10-CM

## 2014-03-17 ENCOUNTER — Ambulatory Visit
Admission: RE | Admit: 2014-03-17 | Discharge: 2014-03-17 | Disposition: A | Discharge status: Home / Self Care | Payer: Managed Care, Other (non HMO) | Source: Ambulatory Visit | Attending: Family Medicine | Admitting: Family Medicine

## 2014-03-17 ENCOUNTER — Encounter (INDEPENDENT_AMBULATORY_CARE_PROVIDER_SITE_OTHER): Payer: Self-pay

## 2014-03-17 DIAGNOSIS — R1084 Generalized abdominal pain: Secondary | ICD-10-CM

## 2014-04-04 ENCOUNTER — Ambulatory Visit (INDEPENDENT_AMBULATORY_CARE_PROVIDER_SITE_OTHER): Payer: Managed Care, Other (non HMO) | Admitting: Internal Medicine

## 2014-04-04 ENCOUNTER — Other Ambulatory Visit (INDEPENDENT_AMBULATORY_CARE_PROVIDER_SITE_OTHER): Payer: Managed Care, Other (non HMO)

## 2014-04-04 ENCOUNTER — Encounter: Payer: Self-pay | Admitting: Internal Medicine

## 2014-04-04 VITALS — BP 100/58 | HR 76 | Ht 65.0 in | Wt 138.5 lb

## 2014-04-04 DIAGNOSIS — R1033 Periumbilical pain: Secondary | ICD-10-CM

## 2014-04-04 DIAGNOSIS — K429 Umbilical hernia without obstruction or gangrene: Secondary | ICD-10-CM

## 2014-04-04 LAB — CBC WITH DIFFERENTIAL/PLATELET
Basophils Absolute: 0.1 10*3/uL (ref 0.0–0.1)
Basophils Relative: 1 % (ref 0.0–3.0)
Eosinophils Absolute: 0.1 10*3/uL (ref 0.0–0.7)
Eosinophils Relative: 1.5 % (ref 0.0–5.0)
HCT: 44.2 % (ref 36.0–46.0)
Hemoglobin: 15.3 g/dL — ABNORMAL HIGH (ref 12.0–15.0)
Lymphocytes Relative: 33.3 % (ref 12.0–46.0)
Lymphs Abs: 1.8 10*3/uL (ref 0.7–4.0)
MCHC: 34.5 g/dL (ref 30.0–36.0)
MCV: 91.9 fl (ref 78.0–100.0)
Monocytes Absolute: 0.4 10*3/uL (ref 0.1–1.0)
Monocytes Relative: 8 % (ref 3.0–12.0)
Neutro Abs: 3.1 10*3/uL (ref 1.4–7.7)
Neutrophils Relative %: 56.2 % (ref 43.0–77.0)
Platelets: 264 10*3/uL (ref 150.0–400.0)
RBC: 4.81 Mil/uL (ref 3.87–5.11)
RDW: 12.9 % (ref 11.5–15.5)
WBC: 5.5 10*3/uL (ref 4.0–10.5)

## 2014-04-04 LAB — COMPREHENSIVE METABOLIC PANEL
ALT: 19 U/L (ref 0–35)
AST: 19 U/L (ref 0–37)
Albumin: 4.3 g/dL (ref 3.5–5.2)
Alkaline Phosphatase: 60 U/L (ref 39–117)
BILIRUBIN TOTAL: 0.4 mg/dL (ref 0.2–1.2)
BUN: 12 mg/dL (ref 6–23)
CO2: 30 meq/L (ref 19–32)
CREATININE: 0.63 mg/dL (ref 0.40–1.20)
Calcium: 9.5 mg/dL (ref 8.4–10.5)
Chloride: 101 mEq/L (ref 96–112)
GFR: 108.55 mL/min (ref >60.00)
Glucose, Bld: 93 mg/dL (ref 70–99)
Potassium: 3.9 mEq/L (ref 3.5–5.1)
Sodium: 135 mEq/L (ref 135–145)
Total Protein: 7.9 g/dL (ref 6.0–8.3)

## 2014-04-04 LAB — SEDIMENTATION RATE: Sed Rate: 11 mm/hr (ref 0–22)

## 2014-04-04 LAB — AMYLASE: Amylase: 45 U/L (ref 27–131)

## 2014-04-04 NOTE — Patient Instructions (Addendum)
  Your physician has requested that you go to the basement for  lab work before leaving today.  Please purchase OTC Omeprazole and take the 14 day pack as discussed.   You have been scheduled for a CT scan of the abdomen and pelvis at [facility] ([address]---this is in the same building as Press [facility]).   You are scheduled on 04/21/14 at 9:00am. You should arrive 15 minutes prior to your appointment time for registration. Please follow the written instructions below on the day of your exam:  WARNING: IF YOU ARE ALLERGIC TO IODINE/X-RAY DYE, PLEASE NOTIFY RADIOLOGY IMMEDIATELY AT [phone]! YOU WILL BE GIVEN A 13 HOUR PREMEDICATION PREP.  1) Do not eat or drink anything after 5:00am (4 hours prior to your test) 2) You have been given 2 bottles of oral contrast to drink. The solution may taste  better if refrigerated, but do NOT add ice or any other liquid to this solution. Shake well before drinking.    Drink 1 bottle of contrast @ 7:00am (2 hours prior to your exam)  Drink 1 bottle of contrast @ 8:00am (1 hour prior to your exam)  You may take any medications as prescribed with a small amount of water except for the following: Metformin, Glucophage, Glucovance, Avandamet, Riomet, Fortamet, Actoplus Met, Janumet, Glumetza or Metaglip. The above medications must be held the day of the exam AND 48 hours after the exam.  The purpose of you drinking the oral contrast is to aid in the visualization of your intestinal tract. The contrast solution may cause some diarrhea. Before your exam is started, you will be given a small amount of fluid to drink. Depending on your individual set of symptoms, you may also receive an intravenous injection of x-ray contrast/dye. Plan on being at [facility] for 30 minutes or long, depending on the type of exam you are having performed.  This test typically takes 30-45 minutes to complete.  If you have any questions regarding your  exam or if you need to reschedule, you may call the CT department at [phone] between the hours of 8:00 am and 5:00 pm, Monday-Friday.  ________________________________________________________________________   I appreciate the opportunity to care for you.     Dr Maurice Small

## 2014-04-04 NOTE — Progress Notes (Signed)
Cynthia Nguyen 07-24-1970 [id number]  Note: This dictation was prepared with Dragon digital system. Any transcriptional errors that result from this procedure are unintentional.   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 Colonoscopy was essentially normal. Sh continues to have  the same sensation around the umbilicus to the left which occurs once or twice a day in no relation to meals, bowel movements or physical activity. It usually does not bother her at night. It is not positional. It never becomes severe. It's always to the left side of the umbilicus. She has never had abdominal operations. Ultrasound of the abdomen in April 2015 revealed   fatty liver. 5.4 cm common bile duct. Her weight has been stable. She has occasional shortness of breath when she eats which she attributes to reflux.  Past Medical History  Diagnosis Date  . Scoliosis   . Fibrocystic breast disease   . Seasonal allergies   . Generalized anxiety disorder     Past Surgical History  Procedure Laterality Date  . Wisdom tooth extraction      Allergies  Allergen Reactions  . Cephalexin     dizziness    Family history and social history have been reviewed.  Review of Systems: Denies dysphagia heartburn blood in stool  The remainder of the 10 point ROS is negative except as outlined in the H&P  Physical Exam: General Appearance Well developed, in no distress Eyes  Non icteric  HEENT  Non traumatic, normocephalic  Mouth No lesion, tongue papillated, no cheilosis Neck Supple without adenopathy, thyroid not enlarged, no carotid bruits, no JVD Lungs Clear to auscultation bilaterally COR Normal S1, normal S2, regular rhythm, no murmur, quiet precordium Abdomen small umbilical , area no rectus diastasis. Normoactive bowel sounds. No tenderness. Her edge at costal margin. 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 There has been no progression of the symptoms which occur several times a day. I can feel a small umbilical hernia. We will proceed with CT scan of the abdomen and pelvis with oral and IV contrast. I'm not really sure whether this is a abdominal wall problem or visceral problem. It also could be due to gastritis or duodenitis. She will purchase omeprazole 20 mg to take it once a day for 2 weeks. We will be checking  amylase, lipase, sprue profile, CBC liver function test today    Delfin Edis 04/04/2014

## 2014-04-07 LAB — GLIA (IGA/G) + TTG IGA
GLIADIN IGA: 13 U (ref <20)
Gliadin IgG: 3 Units (ref <20)
Tissue Transglutaminase Ab, IgA: 1 U/mL (ref <4)

## 2014-04-10 ENCOUNTER — Telehealth: Payer: Self-pay | Admitting: *Deleted

## 2014-04-10 ENCOUNTER — Encounter: Payer: Self-pay | Admitting: *Deleted

## 2014-04-10 NOTE — Telephone Encounter (Signed)
Patient left a message with a question about CBC results. Attempted to return call but mailbox is full.

## 2014-04-11 NOTE — Telephone Encounter (Signed)
Mailbox is full cannot leave a message for patient.

## 2014-04-14 ENCOUNTER — Encounter: Payer: Self-pay | Admitting: Internal Medicine

## 2014-04-14 NOTE — Telephone Encounter (Signed)
Left a message for patient to call back. 

## 2014-04-16 NOTE — Telephone Encounter (Signed)
Dr. Olevia Perches answered this via patient echart.

## 2014-04-21 ENCOUNTER — Ambulatory Visit (INDEPENDENT_AMBULATORY_CARE_PROVIDER_SITE_OTHER)
Admission: RE | Admit: 2014-04-21 | Discharge: 2014-04-21 | Disposition: A | Discharge status: Home / Self Care | Payer: Managed Care, Other (non HMO) | Source: Ambulatory Visit | Attending: Internal Medicine | Admitting: Internal Medicine

## 2014-04-21 DIAGNOSIS — K429 Umbilical hernia without obstruction or gangrene: Secondary | ICD-10-CM

## 2014-04-21 DIAGNOSIS — R1033 Periumbilical pain: Secondary | ICD-10-CM | POA: Diagnosis not present

## 2014-04-21 MED ORDER — IOHEXOL 300 MG/ML  SOLN
100.0000 mL | Freq: Once | INTRAMUSCULAR | Status: AC | PRN
  Administered 2014-04-21: 100 mL via INTRAVENOUS

## 2014-04-22 ENCOUNTER — Other Ambulatory Visit: Payer: Self-pay | Admitting: Obstetrics

## 2014-04-22 DIAGNOSIS — N63 Unspecified lump in unspecified breast: Secondary | ICD-10-CM

## 2014-04-23 ENCOUNTER — Encounter: Payer: Self-pay | Admitting: Internal Medicine

## 2014-04-24 ENCOUNTER — Other Ambulatory Visit: Payer: Managed Care, Other (non HMO)

## 2014-04-28 ENCOUNTER — Ambulatory Visit
Admission: RE | Admit: 2014-04-28 | Discharge: 2014-04-28 | Disposition: A | Discharge status: Home / Self Care | Payer: Managed Care, Other (non HMO) | Source: Ambulatory Visit | Attending: Obstetrics | Admitting: Obstetrics

## 2014-04-28 ENCOUNTER — Encounter: Payer: Self-pay | Admitting: *Deleted

## 2014-04-28 DIAGNOSIS — N63 Unspecified lump in unspecified breast: Secondary | ICD-10-CM

## 2014-11-08 ENCOUNTER — Other Ambulatory Visit: Payer: Self-pay | Admitting: Obstetrics

## 2014-11-08 DIAGNOSIS — N6310 Unspecified lump in the right breast, unspecified quadrant: Secondary | ICD-10-CM

## 2014-11-10 ENCOUNTER — Other Ambulatory Visit (HOSPITAL_COMMUNITY): Payer: Self-pay | Admitting: Respiratory Therapy

## 2014-11-10 ENCOUNTER — Ambulatory Visit (HOSPITAL_COMMUNITY)
Admission: RE | Admit: 2014-11-10 | Discharge: 2014-11-10 | Disposition: A | Discharge status: Home / Self Care | Payer: Managed Care, Other (non HMO) | Source: Ambulatory Visit | Attending: Family Medicine | Admitting: Family Medicine

## 2014-11-10 DIAGNOSIS — R0602 Shortness of breath: Secondary | ICD-10-CM | POA: Insufficient documentation

## 2014-11-10 MED ORDER — ALBUTEROL SULFATE (2.5 MG/3ML) 0.083% IN NEBU
2.5000 mg | INHALATION_SOLUTION | Freq: Once | RESPIRATORY_TRACT | Status: AC
  Administered 2014-11-10: 2.5 mg via RESPIRATORY_TRACT

## 2014-11-12 LAB — PULMONARY FUNCTION TEST
DL/VA % pred: 89 %
DL/VA: 4.41 ml/min/mmHg/L
DLCO UNC % PRED: 73 %
DLCO unc: 18.84 ml/min/mmHg
FEF 25-75 PRE: 3.21 L/s
FEF 25-75 Post: 3.45 L/sec
FEF2575-%CHANGE-POST: 7 %
FEF2575-%Pred-Post: 115 %
FEF2575-%Pred-Pre: 107 %
FEV1-%CHANGE-POST: 2 %
FEV1-%PRED-POST: 99 %
FEV1-%PRED-PRE: 97 %
FEV1-POST: 2.99 L
FEV1-Pre: 2.93 L
FEV1FVC-%Change-Post: 5 %
FEV1FVC-%Pred-Pre: 103 %
FEV6-%Change-Post: -2 %
FEV6-%PRED-PRE: 95 %
FEV6-%Pred-Post: 93 %
FEV6-POST: 3.4 L
FEV6-PRE: 3.48 L
FEV6FVC-%PRED-PRE: 102 %
FEV6FVC-%Pred-Post: 102 %
FVC-%Change-Post: -2 %
FVC-%PRED-POST: 90 %
FVC-%PRED-PRE: 93 %
FVC-POST: 3.4 L
FVC-PRE: 3.49 L
PRE FEV6/FVC RATIO: 100 %
Post FEV1/FVC ratio: 88 %
Post FEV6/FVC ratio: 100 %
Pre FEV1/FVC ratio: 84 %
RV % PRED: 28 %
RV: 0.5 L
TLC % pred: 75 %
TLC: 3.93 L

## 2014-11-13 ENCOUNTER — Encounter: Payer: Self-pay | Admitting: Pulmonary Disease

## 2014-11-14 ENCOUNTER — Encounter (HOSPITAL_COMMUNITY): Payer: Self-pay | Admitting: Emergency Medicine

## 2014-11-14 ENCOUNTER — Emergency Department (HOSPITAL_COMMUNITY): Payer: Managed Care, Other (non HMO)

## 2014-11-14 ENCOUNTER — Emergency Department (HOSPITAL_COMMUNITY)
Admission: EM | Admit: 2014-11-14 | Discharge: 2014-11-14 | Disposition: A | Discharge status: Home / Self Care | Payer: Managed Care, Other (non HMO) | Attending: Emergency Medicine | Admitting: Emergency Medicine

## 2014-11-14 DIAGNOSIS — Z8742 Personal history of other diseases of the female genital tract: Secondary | ICD-10-CM | POA: Insufficient documentation

## 2014-11-14 DIAGNOSIS — F411 Generalized anxiety disorder: Secondary | ICD-10-CM | POA: Insufficient documentation

## 2014-11-14 DIAGNOSIS — Z79899 Other long term (current) drug therapy: Secondary | ICD-10-CM | POA: Insufficient documentation

## 2014-11-14 DIAGNOSIS — R0602 Shortness of breath: Secondary | ICD-10-CM

## 2014-11-14 DIAGNOSIS — Z8679 Personal history of other diseases of the circulatory system: Secondary | ICD-10-CM | POA: Diagnosis not present

## 2014-11-14 DIAGNOSIS — M419 Scoliosis, unspecified: Secondary | ICD-10-CM | POA: Diagnosis not present

## 2014-11-14 DIAGNOSIS — Z3202 Encounter for pregnancy test, result negative: Secondary | ICD-10-CM | POA: Insufficient documentation

## 2014-11-14 HISTORY — DX: Nonrheumatic mitral (valve) prolapse: I34.1

## 2014-11-14 LAB — I-STAT BETA HCG BLOOD, ED (MC, WL, AP ONLY): I-stat hCG, quantitative: <5 m[IU]/mL (ref <5)

## 2014-11-14 LAB — COMPREHENSIVE METABOLIC PANEL
ALT: 17 U/L (ref 14–54)
AST: 19 U/L (ref 15–41)
Albumin: 3.8 g/dL (ref 3.5–5.0)
Alkaline Phosphatase: 44 U/L (ref 38–126)
Anion gap: 6 (ref 5–15)
BILIRUBIN TOTAL: 0.5 mg/dL (ref 0.3–1.2)
BUN: 12 mg/dL (ref 6–20)
CO2: 28 mmol/L (ref 22–32)
Calcium: 8.9 mg/dL (ref 8.9–10.3)
Chloride: 105 mmol/L (ref 101–111)
Creatinine, Ser: 0.78 mg/dL (ref 0.44–1.00)
GFR calc Af Amer: >60 mL/min (ref >60)
GFR calc non Af Amer: >60 mL/min (ref >60)
Glucose, Bld: 136 mg/dL — ABNORMAL HIGH (ref 65–99)
Potassium: 3.5 mmol/L (ref 3.5–5.1)
Sodium: 139 mmol/L (ref 135–145)
TOTAL PROTEIN: 7.2 g/dL (ref 6.5–8.1)

## 2014-11-14 LAB — CBC WITH DIFFERENTIAL/PLATELET
Basophils Absolute: 0.1 10*3/uL (ref 0.0–0.1)
Basophils Relative: 1 %
Eosinophils Absolute: 0.1 10*3/uL (ref 0.0–0.7)
Eosinophils Relative: 1 %
HEMATOCRIT: 40.9 % (ref 36.0–46.0)
Hemoglobin: 14.1 g/dL (ref 12.0–15.0)
Lymphocytes Relative: 31 %
Lymphs Abs: 2.3 10*3/uL (ref 0.7–4.0)
MCH: 31.8 pg (ref 26.0–34.0)
MCHC: 34.5 g/dL (ref 30.0–36.0)
MCV: 92.1 fL (ref 78.0–100.0)
MONO ABS: 0.5 10*3/uL (ref 0.1–1.0)
MONOS PCT: 7 %
NEUTROS ABS: 4.4 10*3/uL (ref 1.7–7.7)
Neutrophils Relative %: 60 %
Platelets: 237 10*3/uL (ref 150–400)
RBC: 4.44 MIL/uL (ref 3.87–5.11)
RDW: 12.4 % (ref 11.5–15.5)
WBC: 7.4 10*3/uL (ref 4.0–10.5)

## 2014-11-14 LAB — D-DIMER, QUANTITATIVE (NOT AT ARMC): D-Dimer, Quant: <0.27 ug/mL-FEU (ref 0.00–0.48)

## 2014-11-14 LAB — TROPONIN I

## 2014-11-14 MED ORDER — IOHEXOL 350 MG/ML SOLN
100.0000 mL | Freq: Once | INTRAVENOUS | Status: AC | PRN
  Administered 2014-11-14: 70 mL via INTRAVENOUS

## 2014-11-14 NOTE — Discharge Instructions (Signed)
You have been seen today for shortness of breath. Your imaging and lab tests showed no abnormalities, including no evidence of Pulmonary Embolism or Pulmonary Fibrosis. Follow up with PCP as needed. Return to ED should symptoms worsen.

## 2014-11-14 NOTE — ED Provider Notes (Signed)
Complains of shortness of breath for the past 4 months becoming worse over the past 1 week. She reports having taken a long flight one week ago. She denies chest pain. Associated symptoms include minimal cough no fever. No orthopnea No other associated symptoms. On exam no distress HEENT exam normocephalic/atraumatic no facial asymmetry neck supple no bruit no JVD lungs clear to auscultation heart regular rate rhythm no murmurs abdomen nondistended nontender extremities without edema. ED ECG REPORT   Date: 11/14/2014  Rate: 90  Rhythm: normal sinus rhythm  QRS Axis: normal  Intervals: normal  ST/T Wave abnormalities: nonspecific T wave changes  Conduction Disutrbances:none  Narrative Interpretation:   Old EKG Reviewed: unchanged  I have personally reviewed the EKG tracing and disagree with the computerized printout as noted. No PVCs seen  Orlie Dakin, MD 11/15/14 [phone]

## 2014-11-14 NOTE — ED Provider Notes (Signed)
CSN: [id number]     Arrival date & time 11/14/14  1412 History   First MD Initiated Contact with Patient 11/14/14 1454     Chief Complaint  Patient presents with  . Shortness of Breath    33 yof presents with c/c shortness of breath x few months progressively worsening since her trip to [place] a few days. States her PCP has been working her up with PFT last month. Denies fever, chills, n/v, calf/leg pain.      (Consider location/radiation/quality/duration/timing/severity/associated sxs/prior Treatment) HPI   Vergil Gosch is a 44 y.o. female, with a history of Scoliosis and MVP, presenting to the ED with shortness of breath. This has been going on for a few months, but has been getting worse following a long flight to [place] a week ago. Pt has been evaluated for her long-term shortness of breath by her PCP, who ordered PFTs, which showed evidence of impaired gas exchange and restriction. Pt denies taking or having any medicine for this issue. Denies hemoptysis, cancer, leg swelling, chest pain, or any other complaints. Pt states she has an appointment to get a high resolution chest CT to rule out fibrosis next week, but came in due to the increased shortness of breath.   Past Medical History  Diagnosis Date  . Scoliosis     per pt >60% curvature  . Fibrocystic breast disease   . Seasonal allergies   . Generalized anxiety disorder   . Mitral valve prolapse     childhood   Past Surgical History  Procedure Laterality Date  . Wisdom tooth extraction     Family History  Problem Relation Age of Onset  . Atrial fibrillation Father   . Diabetes Father   . Colon cancer Father   . Colon polyps Mother   . Hypertension Mother   . Stomach cancer Neg Hx   . Rectal cancer Neg Hx    Social History  Substance Use Topics  . Smoking status: Never Smoker   . Smokeless tobacco: Never Used  . Alcohol Use: 1.0 oz/week    2 Standard drinks or equivalent per week     Comment: couple per week.     OB History    No data available     Review of Systems  Respiratory: Positive for shortness of breath. Negative for cough and chest tightness.   Cardiovascular: Negative for chest pain, palpitations and leg swelling.  Gastrointestinal: Negative for abdominal pain.  All other systems reviewed and are negative.     Allergies  Cephalexin  Home Medications   Prior to Admission medications   Medication Sig Start Date End Date Taking? Authorizing Provider  ALPRAZolam (XANAX) 0.25 MG tablet Take 0.25 mg by mouth 2 (two) times daily as needed for anxiety.  10/05/12  Yes Historical Provider, MD  Biotin 1 MG CAPS Take 1 capsule by mouth daily.   Yes Historical Provider, MD  cholecalciferol (VITAMIN D) 1000 UNITS tablet Take 1,000 Units by mouth daily.   Yes Historical Provider, MD  glucosamine-chondroitin 500-400 MG tablet Take 1 tablet by mouth 3 (three) times daily.   Yes Historical Provider, MD  hydrOXYzine (ATARAX/VISTARIL) 50 MG tablet Take 50 mg by mouth every 6 (six) hours as needed for anxiety.  11/10/14  Yes Historical Provider, MD  levocetirizine (XYZAL) 5 MG tablet Take 5 mg by mouth every evening.  07/12/13  Yes Historical Provider, MD  Omega-3 Fatty Acids (FISH OIL) 1000 MG CAPS Take 1 capsule by mouth daily.  Yes Historical Provider, MD   BP 112/73 mmHg  Pulse 79  Temp(Src) 98.2 F (36.8 C) (Oral)  Resp 14  Ht 5\' 5"  (1.651 m)  Wt 137 lb (62.143 kg)  BMI 22.80 kg/m2  SpO2 97%  LMP 11/09/2014 Physical Exam  Constitutional: She appears well-developed and well-nourished. No distress.  HENT:  Head: Normocephalic and atraumatic.  Mouth/Throat: Oropharynx is clear and moist.  Eyes: Conjunctivae are normal. Pupils are equal, round, and reactive to light.  Cardiovascular: Normal rate, regular rhythm, normal heart sounds and intact distal pulses.   Pulmonary/Chest: Effort normal and breath sounds normal. No respiratory distress.  No increased work of breathing. Able to speak  in full sentences.   Abdominal: Soft. Bowel sounds are normal.  Musculoskeletal: She exhibits no edema or tenderness.  Lymphadenopathy:    She has no cervical adenopathy.  Neurological: She is alert.  Skin: Skin is warm and dry. She is not diaphoretic.  Nursing note and vitals reviewed.   ED Course  Procedures (including critical care time) Labs Review Labs Reviewed  COMPREHENSIVE METABOLIC PANEL - Abnormal; Notable for the following:    Glucose, Bld 136 (*)    All other components within normal limits  CBC WITH DIFFERENTIAL/PLATELET  D-DIMER, QUANTITATIVE (NOT AT [facility])  TROPONIN I  I-STAT BETA HCG BLOOD, ED ([facility], [facility], [facility] ONLY)    Imaging Review Dg Chest 2 View  11/14/2014  CLINICAL DATA:  Shortness of breath.  No cardiopulmonary problems. EXAM: CHEST  2 VIEW COMPARISON:  None. FINDINGS: There is no focal parenchymal opacity. There is no pleural effusion or pneumothorax. The heart and mediastinal contours are unremarkable. There is a severe S-shaped scoliosis of the thoracolumbar spine. IMPRESSION: No active cardiopulmonary disease. Electronically Signed   By: Kathreen Devoid   On: 11/14/2014 14:58   Ct Angio Chest Pe W/cm &/or Wo Cm  11/14/2014  CLINICAL DATA:  44 year old female with several months of progressively worsening shortness of breath. EXAM: CT ANGIOGRAPHY CHEST WITH CONTRAST TECHNIQUE: Multidetector CT imaging of the chest was performed using the standard protocol during bolus administration of intravenous contrast. Multiplanar CT image reconstructions and MIPs were obtained to evaluate the vascular anatomy. CONTRAST:  45mL OMNIPAQUE IOHEXOL 350 MG/ML SOLN COMPARISON:  Chest x-ray 11/14/2014 FINDINGS: No filling defects in the pulmonary arteries to suggest pulmonary emboli. Insert Heart No mediastinal, hilar, or axillary adenopathy. Chest wall soft tissues are unremarkable. Imaging into the upper abdomen shows no acute findings. Lungs are clear. No focal airspace  opacities or suspicious nodules. No effusions. Additional high-resolution images throughout the lungs demonstrates no evidence of fibrosis. Severe rightward scoliosis of the thoracic spine. No acute bony abnormality. Review of the MIP images confirms the above findings. IMPRESSION: No evidence of pulmonary embolus. No evidence of pulmonary fibrosis. No acute findings. Electronically Signed   By: Rolm Baptise M.D.   On: 11/14/2014 16:25   I have personally reviewed and evaluated these images and lab results as part of my medical decision-making.   EKG Interpretation None      MDM   Final diagnoses:  Shortness of breath    Idabell Stultz presents with shortness of breath.  Findings and plan of care discussed with Orlie Dakin, MD.  Suspect PE versus pulmonary fibrosis. Well's score puts patient in moderate risk category, however patient has no risk factors and the [facility] Rule applies. Spoke with radiologist to ask which exam to order to rule out PE as well as obtain the high  resolution CT that patient's PCP requested. Advised to order a regular CTA and put in comments "Do additional high res cuts for pulmononary fibrosis evaluation."  4:33 PM patient is still resting comfortably on her bed. CT reveals no evidence of PE or pulmonary fibrosis. CT results were communicated with the patient as well as the discharge plan, which includes patient following up with her PCP and pulmonologist this week. Patient agrees to this plan and is comfortable with discharge. Troponin is negative. D-dimer was negative      Lorayne Bender, PA-C 11/14/14 1732  Orlie Dakin, MD 11/15/14 [phone]

## 2014-11-14 NOTE — ED Notes (Signed)
53 yof presents with c/c shortness of breath x few months progressively worsening since her trip to [place] a few days. States her PCP has been working her up with PFT last month. Denies fever, chills, n/v, calf/leg pain.

## 2014-11-25 ENCOUNTER — Other Ambulatory Visit: Payer: Self-pay | Admitting: General Surgery

## 2014-12-22 ENCOUNTER — Ambulatory Visit (INDEPENDENT_AMBULATORY_CARE_PROVIDER_SITE_OTHER): Payer: Managed Care, Other (non HMO) | Admitting: Pulmonary Disease

## 2014-12-22 ENCOUNTER — Encounter: Payer: Self-pay | Admitting: Pulmonary Disease

## 2014-12-22 DIAGNOSIS — R0600 Dyspnea, unspecified: Secondary | ICD-10-CM | POA: Diagnosis not present

## 2014-12-22 NOTE — Assessment & Plan Note (Signed)
She has an unusual complaint in that she says that she feels like she needs to take a deep breath every few minutes and that yawning makes her feel better. This is been somewhat steady for the last several months. She does not have wheezing, chest pain, or significant cough. This has improved somewhat in the last several months with some new exercises. Objectively she has significant scoliosis with some compression of the left lung on my review of her CT scan from earlier this year (November 2016) and she does have mild inspiratory restriction on her pulmonary function testing with completely normal diffusion capacity. Her pulmonary parenchyma was normal in appearance on the CT chest from November 2016.  Her symptoms do not sound consistent with asthma nor does she have any evidence of airflow obstruction. As her pulmonary parenchyma appears normal I do not think were dealing with an interstitial lung disease. I do worry about her respiratory mechanics to some degree. I think the scoliosis may be at play to some degree but am not certain as to why this would cause a sudden change in the last several months. Concomitant conditions could include a neuromuscular weakness. Today's spirometry performed sitting and supine did suggest a neuromuscular weakness but the patient states that she thinks she did not perform the test correctly. We talked about the possibility of anxiety contribute to her symptoms as she has had unusual manifestations of anxiety in years past. This may be the case but typically this benefits itself as vocal cord dysfunction and she does not have any other symptoms.  Plan: Obtain maximum inspiratory and maximum expiratory pressure testing to evaluate neuromuscular weakness If that is abnormal then we would repeat spirometry sitting and lying flat Use albuterol as needed for now Check an echocardiogram Follow-up in about 2 months after increased exercise and core muscle strengthening.

## 2014-12-22 NOTE — Patient Instructions (Signed)
We will arrange a MIP/MEP test of your respiratory muscle strength, if that is abnormal then we will repeat the sitting and supine breathing test We will order an echocardiogram and call you with the results We will see you back in late January or early February.

## 2014-12-22 NOTE — Progress Notes (Signed)
Subjective:    Patient ID: Cynthia Nguyen, female    DOB: [birth year], 44 y.o.   MRN: [id number]  HPI Chief Complaint  Patient presents with  . Advice Only    Referred by [facility] for abnormal PFT, cough, sob since July 2016.     She is here to see me today for evaluation of shortness of breath that has been progressing since July of this year. She said that she had a normal childhood without respiratory illnesses and she never smoked. 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 She says that she did have mitral valve prolapse during her teenage years which was treated briefly with a beta blocker. However, over the last several months since July of this year she has had the sensation that she needs to take a deep breath every 3 or 4 minutes. She says it's almost predictable, and that she will feel that she doesn't have enough air in her chest and she has to either on or take a very deep breath to relieve this sensation. Sometimes this is associated with chest tightness if "she goes too long", or if she has anxiety. She says that there have been no major changes in her environment, she lives in a house that has a crawl space. There is no mold or mildew or dust there. She does not have a basement. She does not have any new hats. There have been no changes in her office environment and she's not working with any new chemicals at work. She says that she has been exercising a bit more recently and has been focusing on core tightening exercises and this has improved her sensation somewhat. She does not necessarily feel limited in her activity, she just can't take a deep breath. When asked specifically about changes in her environment she does tell me that her father died approximately 2 years ago and this is been very difficult for her. However, she says that she does not feel overly anxious. She does note that at times in her past she has had anxiety manifested with physical manifestations rather than classic  anxiety type symptoms.Given b-blocker, worked    Past Medical History  Diagnosis Date  . Scoliosis     per pt >60% curvature  . Fibrocystic breast disease   . Seasonal allergies   . Generalized anxiety disorder   . Mitral valve prolapse     childhood     Family History  Problem Relation Age of Onset  . Atrial fibrillation Father   . Diabetes Father   . Colon cancer Father   . Colon polyps Mother   . Hypertension Mother   . Stomach cancer Neg Hx   . Rectal cancer Neg Hx   . Emphysema Paternal Uncle      Social History   Social History  . Marital Status: Single    Spouse Name: N/A  . Number of Children: N/A  . Years of Education: N/A   Occupational History  . Dentist    Social History Main Topics  . Smoking status: Never Smoker   . Smokeless tobacco: Never Used  . Alcohol Use: 1.2 oz/week    2 Standard drinks or equivalent per week     Comment: couple per week.   . Drug Use: No  . Sexual Activity: Not on file   Other Topics Concern  . Not on file   Social History Narrative     Allergies  Allergen Reactions  . Cephalexin  dizziness     Outpatient Prescriptions Prior to Visit  Medication Sig Dispense Refill  . ALPRAZolam (XANAX) 0.25 MG tablet Take 0.25 mg by mouth 2 (two) times daily as needed for anxiety.     . cholecalciferol (VITAMIN D) 1000 UNITS tablet Take 1,000 Units by mouth daily.    . hydrOXYzine (ATARAX/VISTARIL) 50 MG tablet Take 50 mg by mouth every 6 (six) hours as needed for anxiety.     Marland Kitchen levocetirizine (XYZAL) 5 MG tablet Take 5 mg by mouth every evening.     . Biotin 1 MG CAPS Take 1 capsule by mouth daily. Reported on 12/22/2014    . glucosamine-chondroitin 500-400 MG tablet Take 1 tablet by mouth 3 (three) times daily. Reported on 12/22/2014    . Omega-3 Fatty Acids (FISH OIL) 1000 MG CAPS Take 1 capsule by mouth daily. Reported on 12/22/2014     No facility-administered medications prior to visit.       Review of Systems    Constitutional: Negative for fever and unexpected weight change.  HENT: Negative for congestion, dental problem, ear pain, nosebleeds, postnasal drip, rhinorrhea, sinus pressure, sneezing, sore throat and trouble swallowing.   Eyes: Negative for redness and itching.  Respiratory: Positive for shortness of breath. Negative for cough, chest tightness and wheezing.   Cardiovascular: Negative for palpitations and leg swelling.  Gastrointestinal: Negative for nausea and vomiting.  Genitourinary: Negative for dysuria.  Musculoskeletal: Negative for joint swelling.  Skin: Negative for rash.  Neurological: Negative for headaches.  Hematological: Does not bruise/bleed easily.  Psychiatric/Behavioral: Negative for dysphoric mood. The patient is not nervous/anxious.        Objective:   Physical Exam Filed Vitals:   12/22/14 1346  BP: 126/72  Pulse: 94  Height: 5\' 5"  (1.651 m)  Weight: 137 lb (62.143 kg)  SpO2: 100%  RA  Gen: well appearing, no acute distress HENT: NCAT, OP clear, neck supple without masses Eyes: PERRL, EOMi Lymph: no cervical lymphadenopathy PULM: CTA B CV: RRR, no mgr, no JVD GI: BS+, soft, nontender, no hsm Derm: no rash or skin breakdown MSK: notable scoliosis, normal bulk and tone Neuro: A&Ox4, CN II-XII intact, strength 5/5 in all 4 extremities Psyche: normal mood and affect   October 2016 office visit from primary care physician notes reviewed where she complained of some shortness of breath going on for several months. Her function testing was encouraged. She was referred to [place].  November 2016 pulmonary function testing ratio 88%, FEV1 2.99 L (99% predicted), FVC 3.40 L (90% predicted), total lung capacity 3.93 L (75% predicted), ERV 0.88 (60% predicted), DLCO 18.84 (73% predicted) deceased  12/07/2014 sitting spirometry FEV1 3.24 L (111% predicted, supine spirometry 2.45 L (84% predicted), patient notes that she thinks she did not perform the test  adequately, in adequate flow volume loop noted on review of data 2014-12-07 fraction excretion nitric oxide 8 ppm    Assessment & Plan:  Dyspnea She has an unusual complaint in that she says that she feels like she needs to take a deep breath every few minutes and that yawning makes her feel better. This is been somewhat steady for the last several months. She does not have wheezing, chest pain, or significant cough. This has improved somewhat in the last several months with some new exercises. Objectively she has significant scoliosis with some compression of the left lung on my review of her CT scan from earlier this year (November 2016) and she does have  mild inspiratory restriction on her pulmonary function testing with completely normal diffusion capacity. Her pulmonary parenchyma was normal in appearance on the CT chest from November 2016.  Her symptoms do not sound consistent with asthma nor does she have any evidence of airflow obstruction. As her pulmonary parenchyma appears normal I do not think were dealing with an interstitial lung disease. I do worry about her respiratory mechanics to some degree. I think the scoliosis may be at play to some degree but am not certain as to why this would cause a sudden change in the last several months. Concomitant conditions could include a neuromuscular weakness. Today's spirometry performed sitting and supine did suggest a neuromuscular weakness but the patient states that she thinks she did not perform the test correctly. We talked about the possibility of anxiety contribute to her symptoms as she has had unusual manifestations of anxiety in years past. This may be the case but typically this benefits itself as vocal cord dysfunction and she does not have any other symptoms.  Plan: Obtain maximum inspiratory and maximum expiratory pressure testing to evaluate neuromuscular weakness If that is abnormal then we would repeat spirometry sitting and lying  flat Use albuterol as needed for now Check an echocardiogram Follow-up in about 2 months after increased exercise and core muscle strengthening.  > 50% of time spent face to face in a one hour visit   Current outpatient prescriptions:  .  ALPRAZolam (XANAX) 0.25 MG tablet, Take 0.25 mg by mouth 2 (two) times daily as needed for anxiety. , Disp: , Rfl:  .  cholecalciferol (VITAMIN D) 1000 UNITS tablet, Take 1,000 Units by mouth daily., Disp: , Rfl:  .  hydrOXYzine (ATARAX/VISTARIL) 50 MG tablet, Take 50 mg by mouth every 6 (six) hours as needed for anxiety. , Disp: , Rfl:  .  levocetirizine (XYZAL) 5 MG tablet, Take 5 mg by mouth every evening. , Disp: , Rfl:  .  vitamin B-12 (CYANOCOBALAMIN) 100 MCG tablet, Take 1,000 mcg by mouth daily., Disp: , Rfl:

## 2015-01-06 ENCOUNTER — Ambulatory Visit (HOSPITAL_BASED_OUTPATIENT_CLINIC_OR_DEPARTMENT_OTHER): Payer: Managed Care, Other (non HMO)

## 2015-01-06 ENCOUNTER — Other Ambulatory Visit: Payer: Self-pay

## 2015-01-06 ENCOUNTER — Ambulatory Visit (HOSPITAL_COMMUNITY)
Admission: RE | Admit: 2015-01-06 | Discharge: 2015-01-06 | Disposition: A | Discharge status: Home / Self Care | Payer: Managed Care, Other (non HMO) | Source: Ambulatory Visit | Attending: Pulmonary Disease | Admitting: Pulmonary Disease

## 2015-01-06 DIAGNOSIS — R0600 Dyspnea, unspecified: Secondary | ICD-10-CM | POA: Diagnosis not present

## 2015-01-08 ENCOUNTER — Encounter: Payer: Self-pay | Admitting: Pulmonary Disease

## 2015-01-16 ENCOUNTER — Telehealth: Payer: Self-pay | Admitting: Pulmonary Disease

## 2015-01-16 NOTE — Telephone Encounter (Signed)
Notes Recorded by Juanito Doom, MD on 01/14/2015 at 7:13 PM A, Please let her know that I was happy to see that this was normal. Thanks B  Results have been explained to patient, pt expressed understanding. Nothing further needed.

## 2015-01-30 ENCOUNTER — Encounter: Payer: Self-pay | Admitting: Pulmonary Disease

## 2015-01-30 ENCOUNTER — Ambulatory Visit (INDEPENDENT_AMBULATORY_CARE_PROVIDER_SITE_OTHER): Payer: Managed Care, Other (non HMO) | Admitting: Pulmonary Disease

## 2015-01-30 VITALS — BP 114/62 | HR 83 | Ht 65.0 in | Wt 140.0 lb

## 2015-01-30 DIAGNOSIS — R0600 Dyspnea, unspecified: Secondary | ICD-10-CM

## 2015-01-30 NOTE — Progress Notes (Signed)
Subjective:    Patient ID: Cynthia Nguyen, female    DOB: 05-03-1970, 45 y.o.   MRN: [id number]  Synopsis: Referred in 2016 for evaluation of shortness of breath. Has a history of scoliosis. 01-Jan-2015 sitting spirometry FEV1 3.24 L (111% predicted, supine spirometry 2.45 L (84% predicted), patient notes that she thinks she did not perform the test adequately, in adequate flow volume loop noted on review of data 01-01-2015 fraction excretion nitric oxide 8 ppm 01-01-2015 echocardiogram LVEF within normal limits, trace pericardial effusion, RV size/function normal, Valves OK 01/2015 MEP 67% pred, MIP 99% pred October 2016 office visit from primary care physician notes reviewed where she complained of some shortness of breath going on for several months. Her function testing was encouraged. She was referred to [place].  November 2016 pulmonary function testing ratio 88%, FEV1 2.99 L (99% predicted), FVC 3.40 L (90% predicted), total lung capacity 3.93 L (75% predicted), ERV 0.88 (60% predicted), DLCO 18.84 (73% predicted) deceased  01-01-2015 sitting spirometry FEV1 3.24 L (111% predicted, supine spirometry 2.45 L (84% predicted), patient notes that she thinks she did not perform the test adequately, in adequate flow volume loop noted on review of data January 01, 2015 fraction excretion nitric oxide 8 ppm  HPI Chief Complaint  Patient presents with  . Follow-up    pt states dyspnea is improved but still present.  Dyspnea worse when tired or with pain.     Laurelyn said that in general her dyspnea has improved some, but she had a bad day once last week. ON cough that day.  She can't really associate anything that was associated with the dyspnea.  Otherwise she is doing well.     Past Medical History  Diagnosis Date  . Scoliosis     per pt >60% curvature  . Fibrocystic breast disease   . Seasonal allergies   . Generalized anxiety disorder   . Mitral valve prolapse     childhood       Review of Systems     Objective:   Physical Exam Filed Vitals:   01/30/15 1338  BP: 114/62  Pulse: 83  Height: 5\' 5"  (1.651 m)  Weight: 140 lb (63.504 kg)  SpO2: 100%  RA  Gen: well appearing HENT: OP clear, TM's clear, neck supple PULM: CTA B, normal percussion CV: RRR, no mgr, trace edema GI: BS+, soft, nontender Derm: no cyanosis or rash Psyche: normal mood and affect         Assessment & Plan:  Dyspnea I have reviewed the results of her CT chest, her pulmonary function testing, her nitric oxide testing, and her maximal is respiratory and expiratory efforts. Fortunately, there is no evidence of an underlying pulmonary parenchymal disease, a pleural disease, or a neuromuscular disease which is causing her very mild restrictive lung disease. I believe that her restrictive lung disease is due to her scoliosis. I see no indication for a medical therapy, but I do think she needs to maintain follow-up with a scoliosis specialist. She has an appointment at [facility] in February.  There was mention of abnormal septal motion on the interpretation from her echocardiogram, otherwise things look normal. I will ask cardiology to help clarify this.  Plan: Follow-up with me on an as-needed basis.     Current outpatient prescriptions:  .  ALPRAZolam (XANAX) 0.25 MG tablet, Take 0.25 mg by mouth 2 (two) times daily as needed for anxiety. , Disp: , Rfl:  .  cholecalciferol (  VITAMIN D) 1000 UNITS tablet, Take 1,000 Units by mouth daily., Disp: , Rfl:  .  hydrOXYzine (ATARAX/VISTARIL) 50 MG tablet, Take 50 mg by mouth every 6 (six) hours as needed for anxiety. , Disp: , Rfl:  .  levocetirizine (XYZAL) 5 MG tablet, Take 5 mg by mouth daily as needed. Reported on 01/30/2015, Disp: , Rfl:  .  vitamin B-12 (CYANOCOBALAMIN) 100 MCG tablet, Take 1,000 mcg by mouth daily., Disp: , Rfl:

## 2015-01-30 NOTE — Patient Instructions (Signed)
We will call you when we have heard from our colleagues at [facility] We will also let she now when we have heard back from the cardiologist regarding the echocardiogram Follow-up with [place] if your symptoms change or worsen

## 2015-01-30 NOTE — Assessment & Plan Note (Addendum)
I have reviewed the results of her CT chest, her pulmonary function testing, her nitric oxide testing, and her maximal is respiratory and expiratory efforts. Fortunately, there is no evidence of an underlying pulmonary parenchymal disease, a pleural disease, or a neuromuscular disease which is causing her very mild restrictive lung disease. I believe that her restrictive lung disease is due to her scoliosis. I see no indication for a medical therapy, but I do think she needs to maintain follow-up with a scoliosis specialist. She has an appointment at [facility] in February.  There was mention of abnormal septal motion on the interpretation from her echocardiogram, otherwise things look normal. I will ask cardiology to help clarify this.  Plan: Follow-up with me on an as-needed basis.

## 2015-02-10 ENCOUNTER — Telehealth: Payer: Self-pay

## 2015-02-10 NOTE — Telephone Encounter (Signed)
lmtcb X1 for pt to relay results/recs.  

## 2015-02-10 NOTE — Telephone Encounter (Signed)
-----   Message from Juanito Doom, MD sent at 02/02/2015  9:16 PM EST ----- A, Can you tell her that I called the cardiologist and he said that the movement on her heart wall was slightly asynchronous but that her EKG was normal in 11/2014 so it doesn't look like there is an electrical cause.  If she wants to discuss further with cardiology I can refer her, but it doesn't sound like this is anything too worrisome. Thanks B ----- Message -----    From: Josue Hector, MD    Sent: 01/30/2015   4:16 PM      To: Juanito Doom, MD  Ruby Cola not sure what else to say her septum has some asynchrony to its contraction  ECG from ER visit 11/2014 not available in epic but does not indicate BBB  Doubt its clinically significant but it's there   ----- Message -----    From: Juanito Doom, MD    Sent: 01/30/2015   2:09 PM      To: Josue Hector, MD  Hi Peter, You interpreted in echocardiogram on this patient and stated that there was "abnormal septal movement ". I have been seeing her for shortness of breath and fortunately there is no clear evidence of an underlying lung disease. Would you be kind enough to clarify what she meant by abnormal septal movement. 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 Thanks, Ruby Cola

## 2015-02-18 NOTE — Telephone Encounter (Signed)
lmtcb X2 for pt.  

## 2015-02-25 NOTE — Telephone Encounter (Signed)
lmtcb X3 for pt.  Letter sent.  Will close message per triage protocol.  

## 2015-03-02 ENCOUNTER — Telehealth: Payer: Self-pay | Admitting: Pulmonary Disease

## 2015-03-02 NOTE — Telephone Encounter (Signed)
Per patient's request, sent message through mychart:  Can you tell her that I called the cardiologist and he said that the movement on her heart wall was slightly asynchronous but that her EKG was normal in 11/2014 so it doesn't look like there is an electrical cause. If she wants to discuss further with cardiology I can refer her, but it doesn't sound like this is anything too worrisome.  Nothing further needed.

## 2015-04-13 ENCOUNTER — Other Ambulatory Visit: Payer: Self-pay

## 2015-04-13 DIAGNOSIS — Z1231 Encounter for screening mammogram for malignant neoplasm of breast: Secondary | ICD-10-CM

## 2015-05-04 ENCOUNTER — Ambulatory Visit
Admission: RE | Admit: 2015-05-04 | Discharge: 2015-05-04 | Disposition: A | Discharge status: Home / Self Care | Payer: Managed Care, Other (non HMO) | Source: Ambulatory Visit

## 2015-05-04 DIAGNOSIS — Z1231 Encounter for screening mammogram for malignant neoplasm of breast: Secondary | ICD-10-CM

## 2015-06-07 NOTE — Progress Notes (Signed)
   HPI The patient presents for evaluation of palpitations.  She has a past history of tachycardia treated age 45 with beta blockers. She came off of this in her mid 89s.   I saw her in the past.  She had an unremarkable monitor.  She otherwise did well.  However, more recently she's had some episodes of shortness of breath. This comes on sporadically. I did see an echo that she had done which was normal and by her pulmonologist. She had a CAT scan without any significant findings. She had pulmonary function testing. She says she sporadically gets shortness of breath. It doesn't happen with activities. She can't work out with her trainer without developing dyspnea. The dyspnea can come on at night and wake her up. She's not however describing classic PND or orthopnea. She doesn't really have the palpitations she was having previously. She's not having any chest pressure, neck or arm discomfort. 7 no weight gain or edema.    Allergies  Allergen Reactions  . Cephalexin     dizziness    Current Outpatient Prescriptions  Medication Sig Dispense Refill  . ALPRAZolam (XANAX) 0.25 MG tablet Take 0.25 mg by mouth 2 (two) times daily as needed for anxiety.     . cholecalciferol (VITAMIN D) 1000 UNITS tablet Take 1,000 Units by mouth daily.    . hydrOXYzine (ATARAX/VISTARIL) 50 MG tablet Take 50 mg by mouth every 6 (six) hours as needed for anxiety.     Marland Kitchen levocetirizine (XYZAL) 5 MG tablet Take 5 mg by mouth daily as needed. Reported on 01/30/2015     No current facility-administered medications for this visit.    Past Medical History  Diagnosis Date  . Scoliosis     per pt >60% curvature  . Fibrocystic breast disease   . Seasonal allergies   . Generalized anxiety disorder   . Mitral valve prolapse     childhood    Past Surgical History  Procedure Laterality Date  . Wisdom tooth extraction      ROS:  As stated in the HPI and negative for all other systems.  PHYSICAL EXAM BP 110/62 mmHg   Pulse 82  Ht 5\' 5"  (1.651 m)  Wt 132 lb (59.875 kg)  BMI 21.97 kg/m2  SpO2 99% GENERAL:  Well appearing NECK:  No jugular venous distention, waveform within normal limits, carotid upstroke brisk and symmetric, no bruits, no thyromegaly LUNGS:  Clear to auscultation bilaterally BACK:  No CVA tenderness CHEST:  Unremarkable HEART:  PMI not displaced or sustained,S1 and S2 within normal limits, no S3, no S4, no clicks, no rubs, no murmurs ABD:  Flat, positive bowel sounds normal in frequency in pitch, no bruits, no rebound, no guarding, no midline pulsatile mass, no hepatomegaly, no splenomegaly EXT:  2 plus pulses throughout, no edema, no cyanosis no clubbing   EKG:  Sinus rhythm, rate 82, axis within normal limits, intervals within normal limits, no acute ST-T wave changes.  06/08/2015    ASSESSMENT AND PLAN  DYSPNEA:  I will check for obstructive CAD.   I will bring the patient back for a POET (Plain Old Exercise Test). This will allow me to screen for obstructive coronary disease, risk stratify and very importantly provide a prescription for exercise.  I will also order a coronary calcium score.  If these are normal.  No further testing will be indicated.

## 2015-06-08 ENCOUNTER — Ambulatory Visit (INDEPENDENT_AMBULATORY_CARE_PROVIDER_SITE_OTHER): Payer: Managed Care, Other (non HMO) | Admitting: Cardiology

## 2015-06-08 ENCOUNTER — Encounter: Payer: Self-pay | Admitting: Cardiology

## 2015-06-08 VITALS — BP 110/62 | HR 82 | Ht 65.0 in | Wt 132.0 lb

## 2015-06-08 DIAGNOSIS — R0602 Shortness of breath: Secondary | ICD-10-CM | POA: Diagnosis not present

## 2015-06-08 NOTE — Patient Instructions (Signed)
Medication Instructions:  Continue current medications  Labwork: NONE  Testing/Procedures: Your physician has requested that you have an exercise tolerance test. For further information please visit [email or web address]. Please also follow instruction sheet, as given.  Your physician has requested that you have a Coronary Calcium Scoring Test, this test is done at our [place]: As Needed  Any Other Special Instructions Will Be Listed Below (If Applicable).  If you need a refill on your cardiac medications before your next appointment, please call your pharmacy.

## 2015-06-12 ENCOUNTER — Ambulatory Visit (INDEPENDENT_AMBULATORY_CARE_PROVIDER_SITE_OTHER)
Admission: RE | Admit: 2015-06-12 | Discharge: 2015-06-12 | Disposition: A | Discharge status: Home / Self Care | Payer: Self-pay | Source: Ambulatory Visit | Attending: Cardiology | Admitting: Cardiology

## 2015-06-12 DIAGNOSIS — R0602 Shortness of breath: Secondary | ICD-10-CM

## 2015-07-16 ENCOUNTER — Telehealth (HOSPITAL_COMMUNITY): Payer: Self-pay

## 2015-07-16 NOTE — Telephone Encounter (Signed)
Encounter complete. 

## 2015-07-17 ENCOUNTER — Telehealth (HOSPITAL_COMMUNITY): Payer: Self-pay

## 2015-07-17 NOTE — Telephone Encounter (Signed)
Encounter complete. 

## 2015-07-21 ENCOUNTER — Ambulatory Visit (HOSPITAL_COMMUNITY)
Admission: RE | Admit: 2015-07-21 | Discharge: 2015-07-21 | Disposition: A | Discharge status: Home / Self Care | Payer: Managed Care, Other (non HMO) | Source: Ambulatory Visit | Attending: Cardiology | Admitting: Cardiology

## 2015-07-21 DIAGNOSIS — R0602 Shortness of breath: Secondary | ICD-10-CM | POA: Diagnosis not present

## 2015-07-21 LAB — EXERCISE TOLERANCE TEST
CHL RATE OF PERCEIVED EXERTION: 17
CSEPED: 9 min
CSEPEDS: 30 s
CSEPEW: 10.9 METS
CSEPPHR: 173 {beats}/min
MPHR: 174 {beats}/min
Percent HR: 99 %
Rest HR: 75 {beats}/min

## 2015-07-24 ENCOUNTER — Telehealth: Payer: Self-pay | Admitting: Cardiology

## 2015-07-24 NOTE — Telephone Encounter (Signed)
Spoke with pt, aware of stress test results. 

## 2015-07-24 NOTE — Telephone Encounter (Signed)
F/u  Pt stated- returning RN phone call; Please call back and discuss.   

## 2015-10-05 ENCOUNTER — Other Ambulatory Visit: Payer: Self-pay | Admitting: Rehabilitation

## 2015-10-05 DIAGNOSIS — M5126 Other intervertebral disc displacement, lumbar region: Secondary | ICD-10-CM

## 2015-10-17 ENCOUNTER — Ambulatory Visit
Admission: RE | Admit: 2015-10-17 | Discharge: 2015-10-17 | Disposition: A | Discharge status: Home / Self Care | Payer: Managed Care, Other (non HMO) | Source: Ambulatory Visit | Attending: Rehabilitation | Admitting: Rehabilitation

## 2015-10-17 DIAGNOSIS — M5126 Other intervertebral disc displacement, lumbar region: Secondary | ICD-10-CM

## 2016-04-18 DIAGNOSIS — Z1321 Encounter for screening for nutritional disorder: Secondary | ICD-10-CM | POA: Diagnosis not present

## 2016-04-18 DIAGNOSIS — Z01419 Encounter for gynecological examination (general) (routine) without abnormal findings: Secondary | ICD-10-CM | POA: Diagnosis not present

## 2016-04-18 DIAGNOSIS — Z131 Encounter for screening for diabetes mellitus: Secondary | ICD-10-CM | POA: Diagnosis not present

## 2016-04-18 DIAGNOSIS — R109 Unspecified abdominal pain: Secondary | ICD-10-CM | POA: Diagnosis not present

## 2016-04-18 DIAGNOSIS — Z6823 Body mass index (BMI) 23.0-23.9, adult: Secondary | ICD-10-CM | POA: Diagnosis not present

## 2016-05-23 ENCOUNTER — Other Ambulatory Visit: Payer: Self-pay | Admitting: Obstetrics

## 2016-05-23 DIAGNOSIS — Z1231 Encounter for screening mammogram for malignant neoplasm of breast: Secondary | ICD-10-CM

## 2016-06-03 ENCOUNTER — Ambulatory Visit
Admission: RE | Admit: 2016-06-03 | Discharge: 2016-06-03 | Disposition: A | Discharge status: Home / Self Care | Payer: 59 | Source: Ambulatory Visit | Attending: Obstetrics | Admitting: Obstetrics

## 2016-06-03 DIAGNOSIS — Z1231 Encounter for screening mammogram for malignant neoplasm of breast: Secondary | ICD-10-CM

## 2016-07-13 DIAGNOSIS — K529 Noninfective gastroenteritis and colitis, unspecified: Secondary | ICD-10-CM | POA: Diagnosis not present

## 2016-09-10 IMAGING — CT CT HEART SCORING
2 series · 16 of 20 positions shown, 18 images · non-contrast
Comparison: Chest CT 11/14/2014.

CLINICAL DATA: Risk stratification

EXAM:
Coronary Calcium Score
TECHNIQUE: The patient was scanned on a Siemens Somatom 64 slice scanner. Axial
non-contrast 3mm slices were carried out through the heart. The data
set was analyzed on a dedicated work station and scored using the
Agatson method.

[Series 2: casc 3.0 i36f 2 bestdiast 68 % · axial · 0.39mm/px · z∈[+1114,+1228]mm · 8 of 50 slices shown, 10 images]
[im 6/50  vessel]
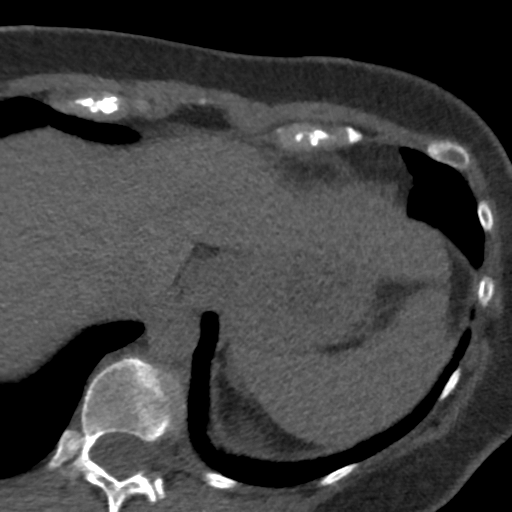
[im 6/50  lung]
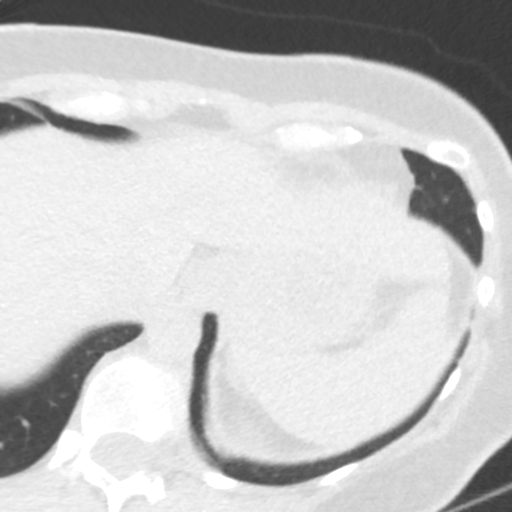
[im 11/50  vessel]
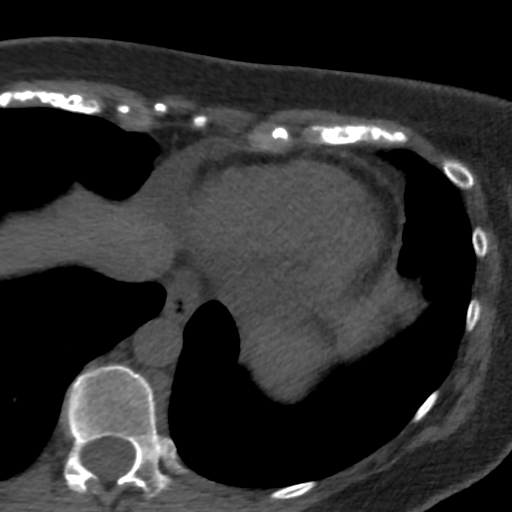
[im 17/50  vessel]
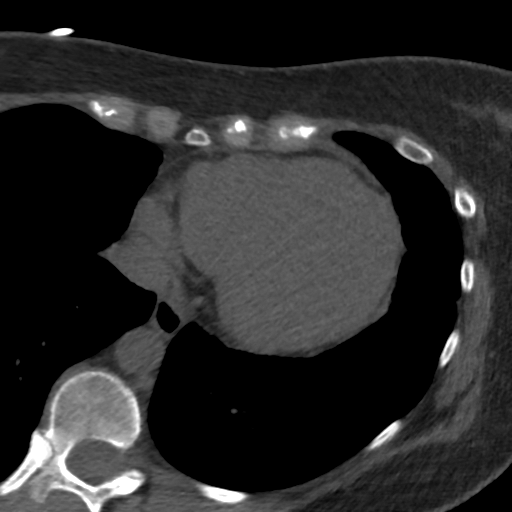
[im 22/50  vessel]
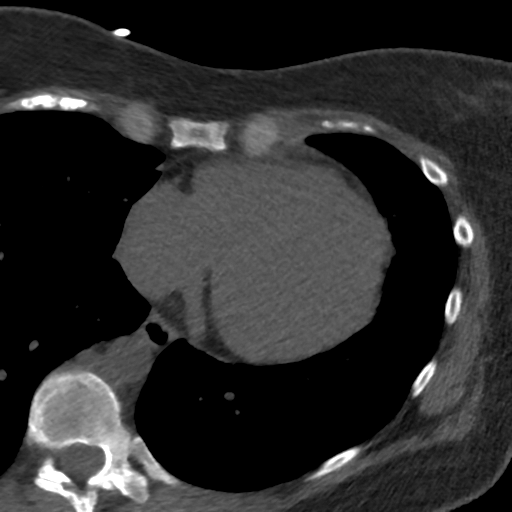
[im 28/50  vessel]
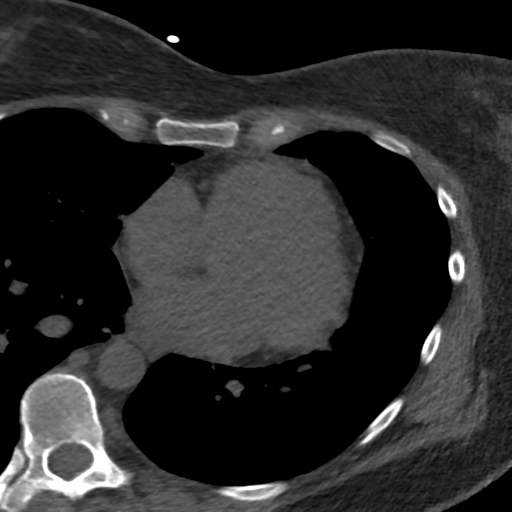
[im 28/50  lung]
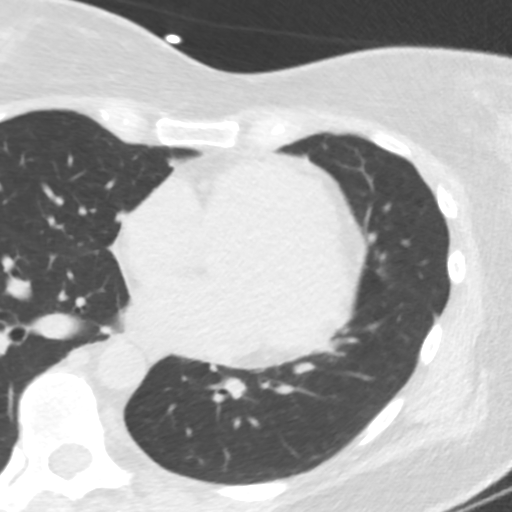
[im 33/50  vessel]
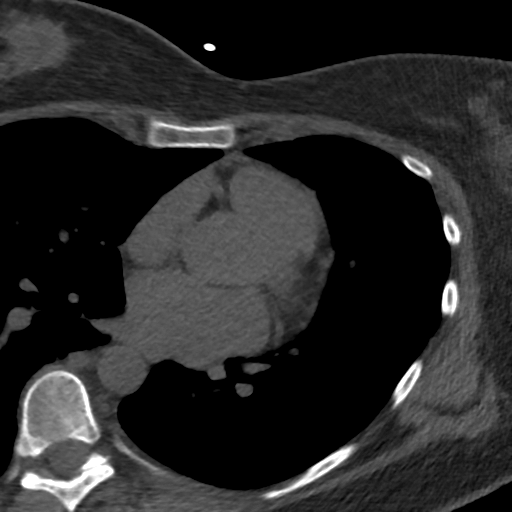
[im 39/50  vessel]
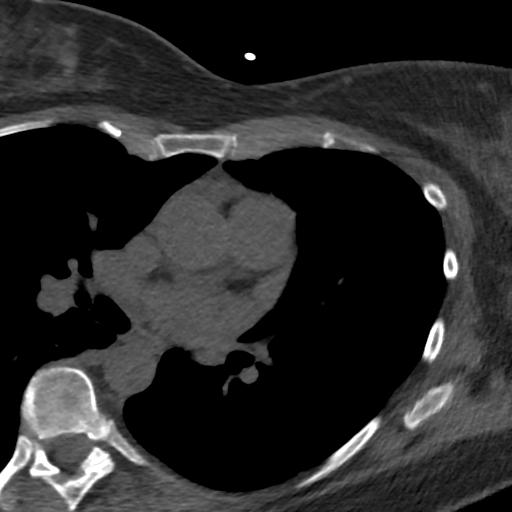
[im 44/50  vessel]
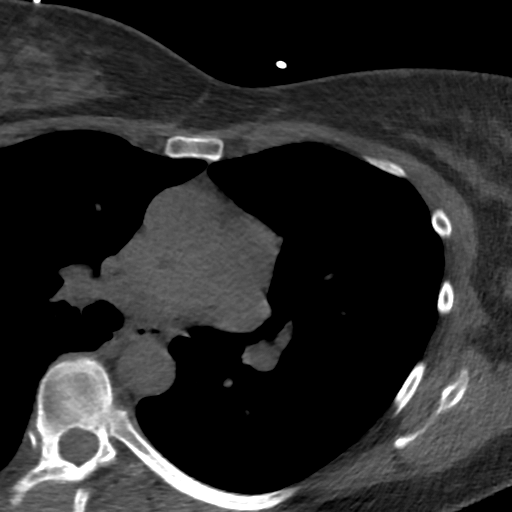

[Series 4: lung st 68 % · axial · 0.40mm/px · z∈[+1114,+1228]mm · 8 of 50 slices shown]
[im 6/50  lung]
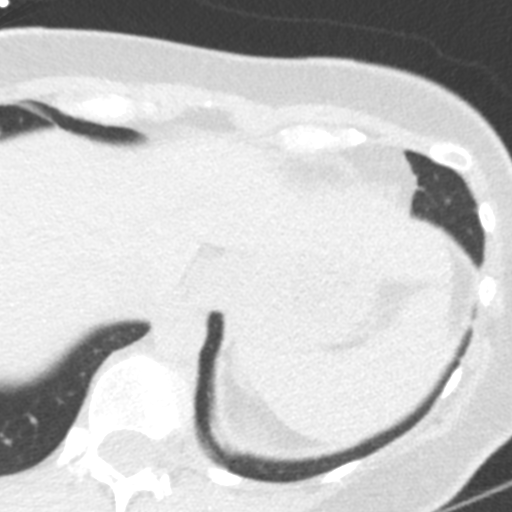
[im 11/50  lung]
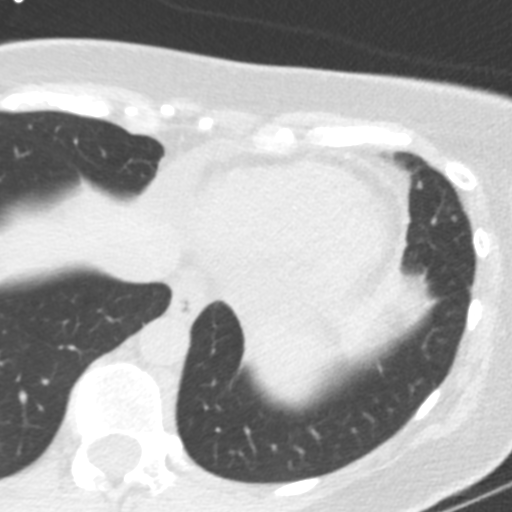
[im 17/50  lung]
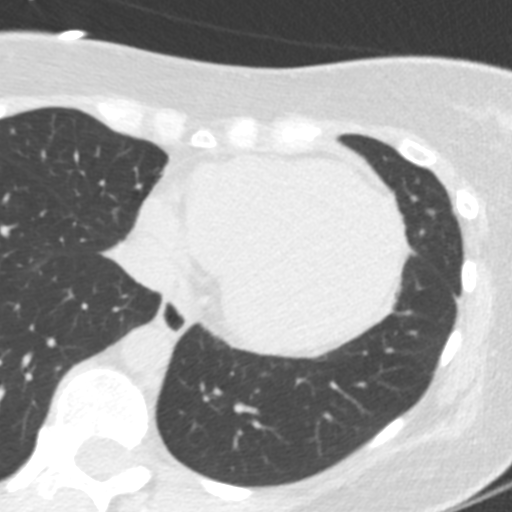
[im 22/50  lung]
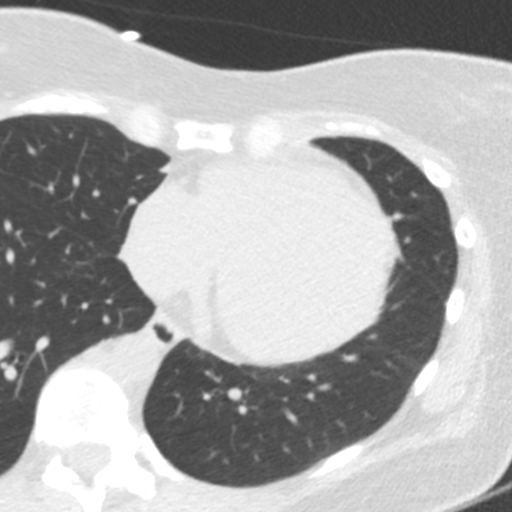
[im 28/50  lung]
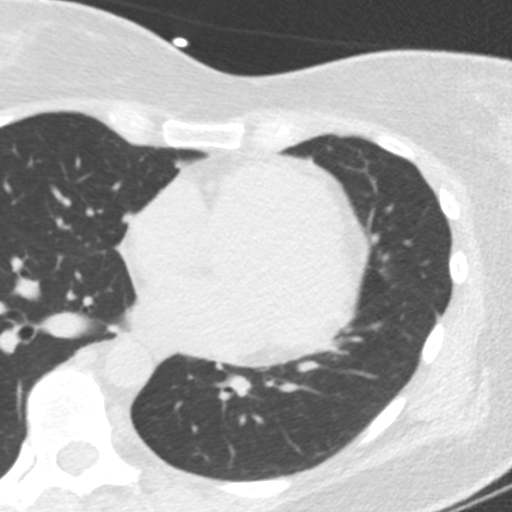
[im 33/50  lung]
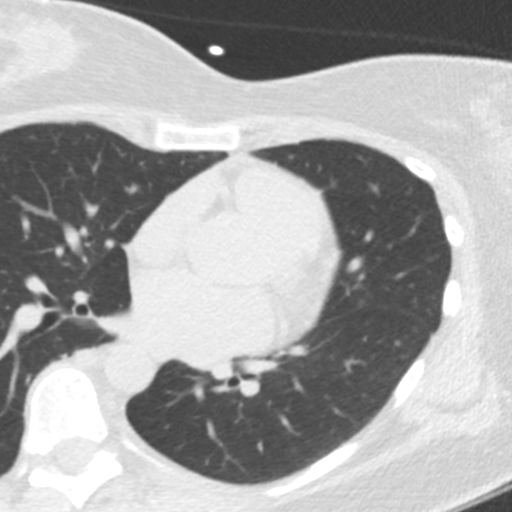
[im 39/50  lung]
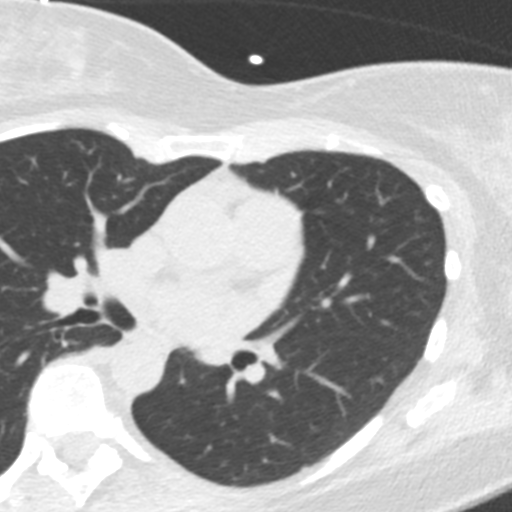
[im 44/50  lung]
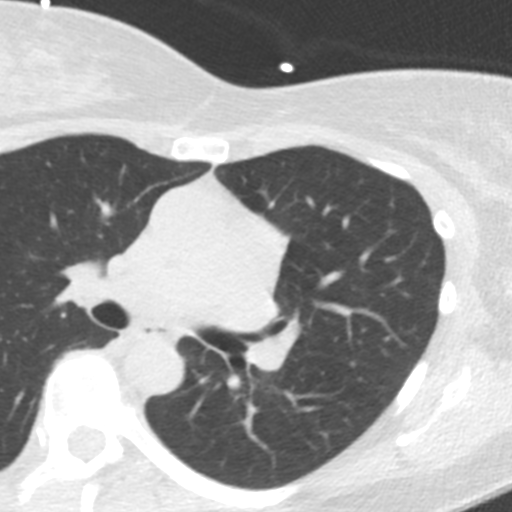

[16 of 20 positions shown; findings below may reference images not displayed]

FINDINGS: Non-cardiac: No significant non cardiac findings on limited lung and
soft tissue windows. See separate report from [REDACTED].

Ascending Aorta:  30 mm

Pericardium: Normal

Coronary arteries:  No calcium detected
IMPRESSION: Coronary calcium score of 0.

Bahil Ravello

EXAM:
OVER-READ INTERPRETATION  CT CHEST

The following report is an over-read performed by radiologist Dr.
over-read does not include interpretation of cardiac or coronary
anatomy or pathology. The coronary calcium score interpretation by
the cardiologist is attached.
FINDINGS: Within the visualized portions of the thorax there are no suspicious
appearing pulmonary nodules or masses, there is no acute
consolidative airspace disease, no pleural effusions, no
pneumothorax and no lymphadenopathy. Visualized portions of the
upper abdomen are unremarkable. Dextroscoliosis of the thoracic
spine. There are no aggressive appearing lytic or blastic lesions
noted in the visualized portions of the skeleton.
IMPRESSION: 1. No significant incidental noncardiac findings noted.

## 2016-09-20 ENCOUNTER — Other Ambulatory Visit: Payer: Self-pay | Admitting: Physician Assistant

## 2016-09-20 DIAGNOSIS — L814 Other melanin hyperpigmentation: Secondary | ICD-10-CM | POA: Diagnosis not present

## 2016-09-20 DIAGNOSIS — D229 Melanocytic nevi, unspecified: Secondary | ICD-10-CM | POA: Diagnosis not present

## 2016-09-20 DIAGNOSIS — D492 Neoplasm of unspecified behavior of bone, soft tissue, and skin: Secondary | ICD-10-CM | POA: Diagnosis not present

## 2016-11-04 DIAGNOSIS — R232 Flushing: Secondary | ICD-10-CM | POA: Diagnosis not present

## 2016-11-04 DIAGNOSIS — Z0000 Encounter for general adult medical examination without abnormal findings: Secondary | ICD-10-CM | POA: Diagnosis not present

## 2016-11-04 DIAGNOSIS — Z13220 Encounter for screening for lipoid disorders: Secondary | ICD-10-CM | POA: Diagnosis not present

## 2016-11-08 DIAGNOSIS — R232 Flushing: Secondary | ICD-10-CM | POA: Diagnosis not present

## 2016-11-08 DIAGNOSIS — N951 Menopausal and female climacteric states: Secondary | ICD-10-CM | POA: Diagnosis not present

## 2016-11-10 ENCOUNTER — Other Ambulatory Visit: Payer: Self-pay | Admitting: Physician Assistant

## 2016-11-10 DIAGNOSIS — L720 Epidermal cyst: Secondary | ICD-10-CM | POA: Diagnosis not present

## 2017-06-02 DIAGNOSIS — Z6823 Body mass index (BMI) 23.0-23.9, adult: Secondary | ICD-10-CM | POA: Diagnosis not present

## 2017-06-02 DIAGNOSIS — Z1159 Encounter for screening for other viral diseases: Secondary | ICD-10-CM | POA: Diagnosis not present

## 2017-06-02 DIAGNOSIS — Z118 Encounter for screening for other infectious and parasitic diseases: Secondary | ICD-10-CM | POA: Diagnosis not present

## 2017-06-02 DIAGNOSIS — Z01419 Encounter for gynecological examination (general) (routine) without abnormal findings: Secondary | ICD-10-CM | POA: Diagnosis not present

## 2017-06-06 ENCOUNTER — Other Ambulatory Visit: Payer: Self-pay | Admitting: Obstetrics

## 2017-06-06 DIAGNOSIS — Z1231 Encounter for screening mammogram for malignant neoplasm of breast: Secondary | ICD-10-CM

## 2017-06-30 ENCOUNTER — Ambulatory Visit
Admission: RE | Admit: 2017-06-30 | Discharge: 2017-06-30 | Disposition: A | Discharge status: Home / Self Care | Payer: 59 | Source: Ambulatory Visit | Attending: Obstetrics | Admitting: Obstetrics

## 2017-06-30 DIAGNOSIS — Z1231 Encounter for screening mammogram for malignant neoplasm of breast: Secondary | ICD-10-CM

## 2017-07-18 ENCOUNTER — Telehealth: Payer: Self-pay | Admitting: Cardiology

## 2017-07-18 NOTE — Telephone Encounter (Signed)
Spoke with patient and she had been noticing palpitations over the last 9 days. Stated she feels fine except can feel the irregular beat when it occurs. They did seem to be getting better but today she hooked herself up to the monitor at work, which showed her HR jumping all over the place.  Did offer if she could get EKG printed and faxed over today could try to get Dr Percival Spanish to review, she did not think she would be able to do that. Scheduled patient appointment for 07/20/17 with Dr Percival Spanish. Will forward to Dr Percival Spanish for review

## 2017-07-18 NOTE — Telephone Encounter (Signed)
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

## 2017-07-19 NOTE — Progress Notes (Addendum)
Cardiology Office Note   Date:  07/20/2017   ID:  Cynthia Nguyen, DOB [birth year], MRN [id number]  PCP:  London Pepper, MD  Cardiologist:   No primary care provider on file. Referring:  Self   Chief Complaint  Patient presents with  . Palpitations      History of Present Illness: Cynthia Nguyen is a 47 y.o. female who presents for evaluation of palpitations.     I last saw her in 2017.    She had tachycardia at age 27 treated with beta blockers.  She came off of these in her mid 94s.  She was having more palpitations in 2015 and had a Holter with no significant arrhythmias.  She had a negative stress test in 2017.  Coronary calcium was zero.  Echo was normal.    She reports that she recently started drinking coffee and taking hormone therapy.  Somewhere around the fifth of this month she started having increased palpitations.  This was not as intense as it was when she was 14 but it was much more severe than it had been at all recently.  She would occasionally get palpitation.  However, this was frequent ectopy happening for long periods of time.  She would notice it during the day or when she tried to go to sleep.  She started taking half of the lowest dose of Xanax and was able to go to bed.  They have been a little less intense in the last day and she was able to sleep well last night.  She is going to stop drinking coffee.  She says that with the palpitations she does not feel presyncopal but she has a very mild headache.  She feels vaguely uncomfortable.  She has not been exercising the last few days but she is going to get back to this.  She does have high anxiety as a baseline and some stress at work.  She has chronic problems with her back with severe scoliosis and occasionally will get back discomfort that shoots through to the front of her chest.  This is a very brief symptom and not new recently.  With exercise she does not bring on any cardiovascular symptoms.  Past Medical  History:  Diagnosis Date  . Fibrocystic breast disease   . Generalized anxiety disorder   . Mitral valve prolapse    childhood  . Scoliosis    per pt >60% curvature  . Seasonal allergies     Past Surgical History:  Procedure Laterality Date  . BREAST CYST ASPIRATION  08/16/2010  . BREAST CYST ASPIRATION  12/25/2009  . BREAST CYST ASPIRATION  07/22/2009  . WISDOM TOOTH EXTRACTION       Current Outpatient Medications  Medication Sig Dispense Refill  . ALPRAZolam (XANAX) 0.25 MG tablet Take 0.25 mg by mouth 2 (two) times daily as needed for anxiety.     Marland Kitchen BIOTIN PO Take 1 tablet by mouth daily.    . calcium carbonate (CALCIUM 600) 600 MG TABS tablet Take 600 mg by mouth daily with breakfast.    . Cyanocobalamin (VITAMIN B-12 PO) Take 1 tablet by mouth daily.    . hydrOXYzine (ATARAX/VISTARIL) 50 MG tablet Take 50 mg by mouth every 6 (six) hours as needed for anxiety.     Marland Kitchen levocetirizine (XYZAL) 5 MG tablet Take 5 mg by mouth daily as needed. Reported on 01/30/2015    . propranolol (INDERAL) 10 MG tablet Take 1 tablet (10 mg total) by mouth  every 6 (six) hours as needed. 60 tablet 6   No current facility-administered medications for this visit.     Allergies:   Cephalexin    ROS:  Please see the history of present illness.   Otherwise, review of systems are positive for none.   All other systems are reviewed and negative.    PHYSICAL EXAM: VS:  BP 92/70 (BP Location: Left Arm, Patient Position: Sitting, Cuff Size: Normal)   Pulse 86   Ht 5\' 5"  (1.651 m)   Wt 140 lb (63.5 kg)   BMI 23.30 kg/m  , BMI Body mass index is 23.3 kg/m. GENERAL:  Well appearing NECK:  No jugular venous distention, waveform within normal limits, carotid upstroke brisk and symmetric, no bruits, no thyromegaly LUNGS:  Clear to auscultation bilaterally BACK:  No CVA tenderness CHEST:  Unremarkable HEART:  PMI not displaced or sustained,S1 and S2 within normal limits, no S3, no S4, no clicks, no  rubs, no murmurs ABD:  Flat, positive bowel sounds normal in frequency in pitch, no bruits, no rebound, no guarding, no midline pulsatile mass, no hepatomegaly, no splenomegaly EXT:  2 plus pulses throughout, no edema, no cyanosis no clubbing   EKG:  EKG is ordered today. The ekg ordered today demonstrates sinus rhythm, rate 86, left axis deviation, low voltage in the limb and chest leads, poor anterior R wave progression, no acute ST-T wave changes.   Recent Labs: No results found for requested labs within last 8760 hours.    Lipid Panel No results found for: CHOL, TRIG, HDL, CHOLHDL, VLDL, LDLCALC, LDLDIRECT    Wt Readings from Last 3 Encounters:  07/20/17 140 lb (63.5 kg)  06/08/15 132 lb (59.9 kg)  01/30/15 140 lb (63.5 kg)      Other studies Reviewed: Additional studies/ records that were reviewed today include: Previous records including EKG.  CXR images reviewed.  . Review of the above records demonstrates:  Please see elsewhere in the note.     ASSESSMENT AND PLAN:  PALPITATIONS:    She is going to stop her caffeine completely.  I gave her propranolol 10 mg as needed to take as a pill in pocket.  I am going to check a TSH and CBC.  She is also going to get an echocardiogram for the abnormal EKG.  She is going to go out and buy a Apple watch.  We talked about the importance of stress reduction and sleep hygiene.  Further evaluation will be based on these results and based on any improvement or worsening in her symptoms.  ABNORMAL EKG: She has a markedly abnormal EKG slightly different than previous.  However, I reviewed her chest x-ray and she has near scoliosis.  I suspect that her abnormalities are related to the positioning of her heart but I will check the echo as above.  POSSIBLE SLEEP APNEA: The patient has insomnia, decreased sleep, possible snoring and sleep apnea.  She has hypersomnolence and elevated Epworth score.  She needs to be ruled out for sleep apnea.    Current medicines are reviewed at length with the patient today.  The patient does not have concerns regarding medicines.  The following changes have been made:  As above.   Labs/ tests ordered today include:   Orders Placed This Encounter  Procedures  . TSH  . CBC  . EKG 12-Lead  . ECHOCARDIOGRAM COMPLETE     Disposition:   FU with me based on the results of the above and  symptoms.      Signed, Minus Breeding, MD  07/20/2017 3:34 PM    [facility]

## 2017-07-20 ENCOUNTER — Encounter: Payer: Self-pay | Admitting: Cardiology

## 2017-07-20 ENCOUNTER — Ambulatory Visit: Payer: 59 | Admitting: Cardiology

## 2017-07-20 VITALS — BP 92/70 | HR 86 | Ht 65.0 in | Wt 140.0 lb

## 2017-07-20 DIAGNOSIS — Z79899 Other long term (current) drug therapy: Secondary | ICD-10-CM | POA: Diagnosis not present

## 2017-07-20 DIAGNOSIS — R5383 Other fatigue: Secondary | ICD-10-CM

## 2017-07-20 DIAGNOSIS — R9431 Abnormal electrocardiogram [ECG] [EKG]: Secondary | ICD-10-CM

## 2017-07-20 DIAGNOSIS — R002 Palpitations: Secondary | ICD-10-CM | POA: Diagnosis not present

## 2017-07-20 MED ORDER — PROPRANOLOL HCL 10 MG PO TABS: 10.0000 mg | ORAL_TABLET | Freq: Four times a day (QID) | ORAL | 6 refills | Status: DC | PRN

## 2017-07-20 NOTE — Patient Instructions (Signed)
Medication Instructions:  START- Propranolol 10 mg take 1 tablet every 6 hours as needed  If you need a refill on your cardiac medications before your next appointment, please call your pharmacy.  Labwork: CBC and TSH today HERE IN OUR OFFICE AT [facility]  Take the provided lab slips with you to the lab for your blood draw.   You will NOT need to fast   Testing/Procedures: Your physician has requested that you have an echocardiogram. Echocardiography is a painless test that uses sound waves to create images of your heart. It provides your doctor with information about the size and shape of your heart and how well your heart's chambers and valves are working. This procedure takes approximately one hour. There are no restrictions for this procedure.  Special Instructions: [facility]  Follow-Up: Your physician wants you to follow-up in: As Needed.      Thank you for choosing [facility] at [facility]!!

## 2017-07-21 LAB — CBC
HEMATOCRIT: 42.5 % (ref 34.0–46.6)
HEMOGLOBIN: 14.6 g/dL (ref 11.1–15.9)
MCH: 32.2 pg (ref 26.6–33.0)
MCHC: 34.4 g/dL (ref 31.5–35.7)
MCV: 94 fL (ref 79–97)
Platelets: 250 10*3/uL (ref 150–450)
RBC: 4.54 x10E6/uL (ref 3.77–5.28)
RDW: 12 % — ABNORMAL LOW (ref 12.3–15.4)
WBC: 6.2 10*3/uL (ref 3.4–10.8)

## 2017-07-21 LAB — TSH: TSH: 1.45 u[IU]/mL (ref 0.450–4.500)

## 2017-07-25 ENCOUNTER — Encounter: Payer: Self-pay | Admitting: Cardiology

## 2017-07-25 NOTE — Telephone Encounter (Signed)
Pt called back to report that she has been having her palpitations at night. Has tried taking there Inderol and it is not giving her relief. She denies shortness of breath, chest pain, and dizziness. Just feels the "skipped beat" when trying to go to bed at night. Advised her that I sent her My Chart message to Dr. Percival Spanish to see if he would like to make any changes and to keep her appointment for her Echo on 08/04/17. Pt agrees.

## 2017-07-25 NOTE — Telephone Encounter (Signed)
Left message for pt re: her My chart message.

## 2017-08-04 ENCOUNTER — Other Ambulatory Visit: Payer: Self-pay

## 2017-08-04 ENCOUNTER — Ambulatory Visit (HOSPITAL_COMMUNITY): Payer: 59 | Attending: Cardiology

## 2017-08-04 DIAGNOSIS — R002 Palpitations: Secondary | ICD-10-CM | POA: Insufficient documentation

## 2017-08-04 DIAGNOSIS — I313 Pericardial effusion (noninflammatory): Secondary | ICD-10-CM | POA: Insufficient documentation

## 2017-08-04 DIAGNOSIS — B081 Molluscum contagiosum: Secondary | ICD-10-CM | POA: Diagnosis not present

## 2017-08-04 DIAGNOSIS — F411 Generalized anxiety disorder: Secondary | ICD-10-CM | POA: Insufficient documentation

## 2017-08-04 DIAGNOSIS — I340 Nonrheumatic mitral (valve) insufficiency: Secondary | ICD-10-CM | POA: Diagnosis not present

## 2017-08-04 DIAGNOSIS — I341 Nonrheumatic mitral (valve) prolapse: Secondary | ICD-10-CM | POA: Diagnosis not present

## 2017-08-04 DIAGNOSIS — R9431 Abnormal electrocardiogram [ECG] [EKG]: Secondary | ICD-10-CM | POA: Diagnosis not present

## 2017-08-07 ENCOUNTER — Telehealth: Payer: Self-pay

## 2017-08-07 NOTE — Telephone Encounter (Signed)
Dr Percival Spanish has reviewed your echocardiogram results (ultrasound of your heart).  The results show that your heart function and structure is stable.   Left detailed message on patient VM.

## 2017-08-07 NOTE — Telephone Encounter (Signed)
-----   Message from Minus Breeding, MD sent at 08/04/2017 10:42 PM EDT ----- Essentially normal echo.  NL LV function.  Call Ms. Mander with the results and send results to London Pepper, MD

## 2017-08-10 DIAGNOSIS — J029 Acute pharyngitis, unspecified: Secondary | ICD-10-CM | POA: Diagnosis not present

## 2017-08-21 DIAGNOSIS — L739 Follicular disorder, unspecified: Secondary | ICD-10-CM | POA: Diagnosis not present

## 2017-08-29 DIAGNOSIS — N9089 Other specified noninflammatory disorders of vulva and perineum: Secondary | ICD-10-CM | POA: Diagnosis not present

## 2017-08-29 DIAGNOSIS — B081 Molluscum contagiosum: Secondary | ICD-10-CM | POA: Diagnosis not present

## 2017-09-14 DIAGNOSIS — J0100 Acute maxillary sinusitis, unspecified: Secondary | ICD-10-CM | POA: Diagnosis not present

## 2017-10-15 DIAGNOSIS — Z23 Encounter for immunization: Secondary | ICD-10-CM | POA: Diagnosis not present

## 2017-11-13 DIAGNOSIS — K219 Gastro-esophageal reflux disease without esophagitis: Secondary | ICD-10-CM | POA: Diagnosis not present

## 2017-11-13 DIAGNOSIS — Z7289 Other problems related to lifestyle: Secondary | ICD-10-CM | POA: Diagnosis not present

## 2017-11-17 DIAGNOSIS — Z136 Encounter for screening for cardiovascular disorders: Secondary | ICD-10-CM | POA: Diagnosis not present

## 2017-11-17 DIAGNOSIS — E559 Vitamin D deficiency, unspecified: Secondary | ICD-10-CM | POA: Diagnosis not present

## 2017-11-17 DIAGNOSIS — R7309 Other abnormal glucose: Secondary | ICD-10-CM | POA: Diagnosis not present

## 2017-11-17 DIAGNOSIS — Z0000 Encounter for general adult medical examination without abnormal findings: Secondary | ICD-10-CM | POA: Diagnosis not present

## 2018-05-24 NOTE — Progress Notes (Signed)
Virtual Visit via Video Note   This visit type was conducted due to national recommendations for restrictions regarding the COVID-19 Pandemic (e.g. social distancing) in an effort to limit this patient's exposure and mitigate transmission in our community.  Due to her co-morbid illnesses, this patient is at least at moderate risk for complications without adequate follow up.  This format is felt to be most appropriate for this patient at this time.  All issues noted in this document were discussed and addressed.  A limited physical exam was performed with this format.  Please refer to the patient's chart for her consent to telehealth for [facility].   Date:  05/25/2018   ID:  Cynthia Nguyen, DOB Jul 08, 1970, MRN [id number]  Patient Location: Home Provider Location: Home  PCP:  London Pepper, MD  Cardiologist:  Minus Breeding, MD  Electrophysiologist:  None   Evaluation Performed:  Follow-Up Visit  Chief Complaint:  Palpitations.    History of Present Illness:    Cynthia Nguyen is a 48 y.o. female who presents for evaluation of palpitations.    She had tachycardia at age 33 treated with beta blockers.  She came off of these in her mid 45s.  She was having more palpitations in 2015 and had a Holter with no significant arrhythmias.  She had a negative stress test in 2017.  Coronary calcium was zero.  Echo was normal.  I saw her last year .  Since I saw her she does periodically get palpitations.  She says occasionally she will take propanolol.  If she does this she is feels fine.  She says occasionally she will have episodes her heart racing.  This might happen when she bends over and stands up.  It might happen when she is resting.  It goes away and does not happen if she takes the beta-blocker in the morning.  She does not want to do this all the time however.  She is not describing chest pressure, neck or arm discomfort.  She is not describing weight gain or edema.  The patient does not  have symptoms concerning for COVID-19 infection (fever, chills, cough, or new shortness of breath).    Past Medical History:  Diagnosis Date  . Fibrocystic breast disease   . Generalized anxiety disorder   . Mitral valve prolapse    childhood  . Scoliosis    per pt >60% curvature  . Seasonal allergies    Past Surgical History:  Procedure Laterality Date  . BREAST CYST ASPIRATION  08/16/2010  . BREAST CYST ASPIRATION  12/25/2009  . BREAST CYST ASPIRATION  07/22/2009  . WISDOM TOOTH EXTRACTION       Current Meds  Medication Sig  . calcium carbonate (CALCIUM 600) 600 MG TABS tablet Take 600 mg by mouth 2 (two) times daily with a meal.   . Cholecalciferol (VITAMIN D3) 30 MCG/15ML LIQD Take by mouth.  . fluticasone (FLONASE) 50 MCG/ACT nasal spray USE 2 SPRAY(S) IN EACH NOSTRIL ONCE DAILY  . progesterone (PROMETRIUM) 100 MG capsule Take by mouth.  . propranolol (INDERAL) 10 MG tablet Take 1 tablet (10 mg total) by mouth every 6 (six) hours as needed.  . [DISCONTINUED] ALPRAZolam (XANAX) 0.25 MG tablet Take 0.25 mg by mouth 2 (two) times daily as needed for anxiety.   . [DISCONTINUED] BIOTIN PO Take 1 tablet by mouth daily.  . [DISCONTINUED] Cyanocobalamin (VITAMIN B-12 PO) Take 1 tablet by mouth daily.  . [DISCONTINUED] hydrOXYzine (ATARAX/VISTARIL) 50 MG tablet Take  50 mg by mouth every 6 (six) hours as needed for anxiety.   . [DISCONTINUED] levocetirizine (XYZAL) 5 MG tablet Take 5 mg by mouth daily as needed. Reported on 01/30/2015     Allergies:   Cephalexin   Social History   Tobacco Use  . Smoking status: Never Smoker  . Smokeless tobacco: Never Used  Substance Use Topics  . Alcohol use: Yes    Alcohol/week: 2.0 standard drinks    Types: 2 Standard drinks or equivalent per week    Comment: couple per week.   . Drug use: No     Family Hx: The patient's family history includes Atrial fibrillation in her father; Colon cancer in her father; Colon polyps in her mother;  Diabetes in her father; Emphysema in her paternal uncle; Hypertension in her mother. There is no history of Stomach cancer or Rectal cancer.  ROS:   Please see the history of present illness.    As stated in the HPI and negative for all other systems.   Prior CV studies:   The following studies were reviewed today:  None  Labs/Other Tests and Data Reviewed:    EKG:  No ECG reviewed.  Recent Labs: 07/20/2017: Hemoglobin 14.6; Platelets 250; TSH 1.450   Recent Lipid Panel No results found for: CHOL, TRIG, HDL, CHOLHDL, LDLCALC, LDLDIRECT  Wt Readings from Last 3 Encounters:  05/25/18 136 lb (61.7 kg)  07/20/17 140 lb (63.5 kg)  06/08/15 132 lb (59.9 kg)     Objective:    Vital Signs:  BP 105/74   Pulse 71   Ht 5\' 4"  (1.626 m)   Wt 136 lb (61.7 kg)   BMI 23.34 kg/m    VITAL SIGNS:  reviewed GEN:  no acute distress NEURO:  alert and oriented x 3, no obvious focal deficit PSYCH:  normal affect  ASSESSMENT & PLAN:    PALPITATIONS:   We had a long discussion about this.  She is can start taking the beta-blocker every day at least once daily and maybe twice daily if she has continued breakthrough palpitations.  She is going to record her rapid heart rates as she does have an apple watch she just did not bother me with these.  I did have her do orthostatic blood pressures and these were fine.  ABNORMAL EKG:  She had an unremarkable echocardiogram.  This may be related to her scoliosis and the way her heart is positioned in her chest.  No further work-up is suggested.   COVID-19 Education: The signs and symptoms of COVID-19 were discussed with the patient and how to seek care for testing (follow up with PCP or arrange E-visit).  The importance of social distancing was discussed today.  Time:   Today, I have spent 25 minutes with the patient with telehealth technology discussing the above problems.     Medication Adjustments/Labs and Tests Ordered: Current medicines are  reviewed at length with the patient today.  Concerns regarding medicines are outlined above.   Tests Ordered: No orders of the defined types were placed in this encounter.   Medication Changes: No orders of the defined types were placed in this encounter.   Disposition:  Follow up with me in one year  Signed, Minus Breeding, MD  05/25/2018 5:16 PM    [place]

## 2018-05-25 ENCOUNTER — Telehealth: Payer: Self-pay | Admitting: Cardiology

## 2018-05-25 ENCOUNTER — Telehealth (INDEPENDENT_AMBULATORY_CARE_PROVIDER_SITE_OTHER): Payer: 59 | Admitting: Cardiology

## 2018-05-25 ENCOUNTER — Encounter: Payer: Self-pay | Admitting: Cardiology

## 2018-05-25 VITALS — BP 105/74 | HR 71 | Ht 64.0 in | Wt 136.0 lb

## 2018-05-25 DIAGNOSIS — R9431 Abnormal electrocardiogram [ECG] [EKG]: Secondary | ICD-10-CM | POA: Diagnosis not present

## 2018-05-25 DIAGNOSIS — R002 Palpitations: Secondary | ICD-10-CM

## 2018-05-25 NOTE — Telephone Encounter (Signed)
New message:   Patient returning call back concering her appt. Please call patient.

## 2018-05-25 NOTE — Patient Instructions (Signed)
Medication Instructions:  Continue current medications  If you need a refill on your cardiac medications before your next appointment, please call your pharmacy.  Labwork: None ordered   Testing/Procedures: None ordered  Follow-Up: You will need a follow up appointment in 1 Year.  Please call our office 2 months in advance to schedule this appointment.  You may see Minus Breeding, MD or one of the following Advanced Practice Providers on your designated Care Team:   Rosaria Ferries, PA-C . Jory Sims, DNP, ANP     At [facility], you and your health needs are our priority.  As part of our continuing mission to provide you with exceptional heart care, we have created designated Provider Care Teams.  These Care Teams include your primary Cardiologist (physician) and Advanced Practice Providers (APPs -  Physician Assistants and Nurse Practitioners) who all work together to provide you with the care you need, when you need it.  Thank you for choosing [facility] at [facility]!!

## 2018-05-25 NOTE — Telephone Encounter (Signed)
Spoke with pt who state she was returning phone a call for [place] in registration. Informed pt a message will be relayed for her to return her call,

## 2018-05-31 ENCOUNTER — Telehealth: Payer: Self-pay | Admitting: *Deleted

## 2018-05-31 DIAGNOSIS — Z79899 Other long term (current) drug therapy: Secondary | ICD-10-CM

## 2018-05-31 DIAGNOSIS — R5383 Other fatigue: Secondary | ICD-10-CM

## 2018-05-31 NOTE — Telephone Encounter (Signed)
-----   Message from Minus Breeding, MD sent at 05/30/2018  4:57 PM EDT ----- Please put in for a TSH and CBC.  Wants to have it on a Friday afternoon.

## 2018-05-31 NOTE — Telephone Encounter (Signed)
TSH and CBC ordered place

## 2018-06-07 ENCOUNTER — Other Ambulatory Visit: Payer: Self-pay | Admitting: Obstetrics

## 2018-06-07 DIAGNOSIS — Z1231 Encounter for screening mammogram for malignant neoplasm of breast: Secondary | ICD-10-CM

## 2018-06-09 LAB — CBC
Hematocrit: 42.8 % (ref 34.0–46.6)
Hemoglobin: 14.7 g/dL (ref 11.1–15.9)
MCH: 30.8 pg (ref 26.6–33.0)
MCHC: 34.3 g/dL (ref 31.5–35.7)
MCV: 90 fL (ref 79–97)
Platelets: 254 10*3/uL (ref 150–450)
RBC: 4.78 x10E6/uL (ref 3.77–5.28)
RDW: 12.4 % (ref 11.7–15.4)
WBC: 5.7 10*3/uL (ref 3.4–10.8)

## 2018-06-09 LAB — TSH: TSH: 1.14 u[IU]/mL (ref 0.450–4.500)

## 2018-06-28 ENCOUNTER — Encounter: Payer: Self-pay | Admitting: *Deleted

## 2018-06-28 NOTE — Telephone Encounter (Signed)
This encounter was created in error - please disregard.

## 2018-06-28 NOTE — Telephone Encounter (Signed)
mychart message sent by patient    ----- Message from Cristopher Estimable, RN sent at 06/27/2018 2:09 PM EDT -----      ----- Message sent from Cristopher Estimable, RN to [name] at 06/27/2018 2:09 PM -----   Ms Cynthia Nguyen, the EKG's all look fine, the one that looks strange is due to artifact or noise on the recording, your heart rhythm is normal. Dr Percival Spanish is not in the office today, he is working in the hospital. I will forward this to him to review.       ----- Message -----    From:Cynthia Nguyen    Sent:06/27/2018 11:23 AM EDT     JJ:OACZY Hochrein, MD  Subject:Visit Follow-Up Question    Dr. Percival Spanish,  I have been taking the medicine every 6 hours because when I don't, I don't feel good. I feel that uneasiness in my chest. I have been noticing more tightness in my chest. Here are 4 ECGs from today. One looks a bit strange; I hope it's just a glitch.    Thank you,  Cynthia Nguyen

## 2018-07-04 ENCOUNTER — Other Ambulatory Visit: Payer: Self-pay | Admitting: *Deleted

## 2018-07-04 ENCOUNTER — Telehealth: Payer: Self-pay | Admitting: *Deleted

## 2018-07-04 MED ORDER — PROPRANOLOL HCL ER 80 MG PO CP24: ORAL_CAPSULE | ORAL | 1 refills | Status: DC

## 2018-07-04 NOTE — Progress Notes (Signed)
rx sent to [facility] and patient uses Cynthia Nguyen. Resent to HT

## 2018-07-04 NOTE — Telephone Encounter (Signed)
Below received via mychart and message sent to patient. Rx sent to [facility]    We could switch to the long acting propranolol starting with 80 mg PO Daily Inderal LA. Start it closer to dinner time and see how you feel. We can go up on that dose as needed. Stop the immediate release.   ----- Message -----  From: Richmond Campbell, LPN  Sent: 6/94/8546  8:05 AM EDT  To: Minus Breeding, MD, Vennie Homans  Subject: FW: Visit Follow-Up Question               ----- Message -----  From: [name]  Sent: 07/03/2018  2:33 AM EDT  To: Cv Div Nl Triage  Subject: Visit Follow-Up Question               Hello Dr Percival Spanish,  When I first started taking the propanolol (10 mg every 6 hours) I felt a lot better. I was able to sleep easily every night and I was very happy. However, it seems that as time has gone by, this medication does not appear to be working. At first, I noticed that I could almost tell when it was time for the next dose bc my heart would start with the palpitations about an hour before the next dose. Now, even though I am taking the pills, I can still feel the palpitations all day and I have not been able to sleep well for several nights bc the palpitations keep me from sleeping. Just so you know, these palpitations are continuous. Is this possible? What do I do? I am sorry to keep bothering you but I just don't feel well.  Thank you,  Cynthia Nguyen

## 2018-07-17 ENCOUNTER — Other Ambulatory Visit: Payer: Self-pay | Admitting: *Deleted

## 2018-07-17 ENCOUNTER — Telehealth: Payer: Self-pay | Admitting: *Deleted

## 2018-07-17 NOTE — Telephone Encounter (Signed)
Per phone note increase Inderal to 80 mg twice a day, mychart message sent to patient

## 2018-07-27 ENCOUNTER — Other Ambulatory Visit: Payer: Self-pay

## 2018-07-27 ENCOUNTER — Ambulatory Visit
Admission: RE | Admit: 2018-07-27 | Discharge: 2018-07-27 | Disposition: A | Discharge status: Home / Self Care | Payer: 59 | Source: Ambulatory Visit | Attending: Obstetrics | Admitting: Obstetrics

## 2018-07-27 DIAGNOSIS — Z1231 Encounter for screening mammogram for malignant neoplasm of breast: Secondary | ICD-10-CM

## 2018-08-10 ENCOUNTER — Telehealth: Payer: Self-pay

## 2018-08-10 DIAGNOSIS — G479 Sleep disorder, unspecified: Secondary | ICD-10-CM

## 2018-08-10 DIAGNOSIS — R5383 Other fatigue: Secondary | ICD-10-CM

## 2018-08-10 NOTE — Telephone Encounter (Signed)
-----   Message from Minus Breeding, MD sent at 08/09/2018  9:16 PM EDT ----- Can you order a sleep study.  How about using fatigue, decreased sleep as the reason

## 2018-08-16 ENCOUNTER — Telehealth: Payer: Self-pay | Admitting: Cardiology

## 2018-08-16 NOTE — Telephone Encounter (Signed)
Dr. Percival Spanish   Can you speak to this patients ability to proceed with a colonoscopy? I see that she has had more issues recently with palpitations and there has been a sleep study ordered. This has yet to be completed.   Would it be reasonable to hold off on the coloscopy until after the sleep study and when her palpitations are more controlled?   PMH includes palpitations since age 48, negative stress test in 2017, coronary calcium score of 0 and normal echocardiogram 08/04/2017.   Please advise and send response to pre-op pool  Thank you  [name]

## 2018-08-16 NOTE — Telephone Encounter (Signed)
   [facility] Pre-operative Risk Assessment    Request for surgical clearance:  1. What type of surgery is being performed? Colonoscopy    2. When is this surgery scheduled? 08/20/2018   3. What type of clearance is required (medical clearance vs. Pharmacy clearance to hold med vs. Both)? Medical   4. Are there any medications that need to be held prior to surgery and how long? None specified   5. Practice name and name of physician performing surgery? Dr. Juanita Craver @ [facility]   6. What is your office phone number [phone]    7.   What is your office fax number [phone]  8.   Anesthesia type (None, local, MAC, general) ? Propofol    Sheral Apley M 08/16/2018, 10:15 AM  _________________________________________________________________   (provider comments below)

## 2018-08-19 NOTE — Telephone Encounter (Signed)
The patient would be able to proceed with colonoscopy.

## 2018-11-22 NOTE — Progress Notes (Signed)
Cardiology Office Note   Date:  11/23/2018   ID:  Cynthia Nguyen, DOB Jul 20, 1970, MRN [id number]  PCP:  London Pepper, MD  Cardiologist:   Minus Breeding, MD Referring:  Self   Chief Complaint  Patient presents with  . Palpitations      History of Present Illness: Cynthia Nguyen is a 48 y.o. female who presents for evaluation of palpitations.     She had tachycardia at age 37 treated with beta blockers.  She came off of these in her mid 53s.  She was having more palpitations in 2015 and had a Holter with no significant arrhythmias.  She had a negative stress test in 2017.  Coronary calcium was zero.  Echo was normal.    Since I last saw her she did have to take her beta-blocker twice a day for a little while but then her symptoms have improved.  She has had weight fewer palpitations.  She has been sleeping better since taking 3 mg of melatonin.  She is not having any presyncope or syncope.  She is avoiding some triggers.  She would have a high chance of dozing if she was sitting, passenger in a car, watching TV, lying down to rest in the afternoon.  There is some snoring and restless sleep   Past Medical History:  Diagnosis Date  . Fibrocystic breast disease   . Generalized anxiety disorder   . Mitral valve prolapse    childhood  . Scoliosis    per pt >60% curvature  . Seasonal allergies     Past Surgical History:  Procedure Laterality Date  . BREAST CYST ASPIRATION  08/16/2010  . BREAST CYST ASPIRATION  12/25/2009  . BREAST CYST ASPIRATION  07/22/2009  . WISDOM TOOTH EXTRACTION       Current Outpatient Medications  Medication Sig Dispense Refill  . calcium carbonate (CALCIUM 600) 600 MG TABS tablet Take 600 mg by mouth 2 (two) times daily with a meal.     . Cholecalciferol (VITAMIN D3) 30 MCG/15ML LIQD Take by mouth.    . fluticasone (FLONASE) 50 MCG/ACT nasal spray USE 2 SPRAY(S) IN EACH NOSTRIL ONCE DAILY    . progesterone (PROMETRIUM) 100 MG capsule Take by  mouth.    . propranolol ER (INDERAL LA) 80 MG 24 hr capsule Take 80 mg by mouth daily.      No current facility-administered medications for this visit.     Allergies:   Cephalexin    ROS:  Please see the history of present illness.   Otherwise, review of systems are positive for none.   All other systems are reviewed and negative.    PHYSICAL EXAM: VS:  BP 103/66   Pulse 73   Temp 98.6 F (37 C)   Ht 5' 3.5" (1.613 m)   Wt 142 lb (64.4 kg)   SpO2 98%   BMI 24.76 kg/m  , BMI Body mass index is 24.76 kg/m. GENERAL:  Well appearing NECK:  No jugular venous distention, waveform within normal limits, carotid upstroke brisk and symmetric, no bruits, no thyromegaly LUNGS:  Clear to auscultation bilaterally CHEST:  Unremarkable HEART:  PMI not displaced or sustained,S1 and S2 within normal limits, no S3, no S4, no clicks, no rubs, no murmurs ABD:  Flat, positive bowel sounds normal in frequency in pitch, no bruits, no rebound, no guarding, no midline pulsatile mass, no hepatomegaly, no splenomegaly EXT:  2 plus pulses throughout, no edema, no cyanosis no clubbing   EKG:  EKG is ordered today. Normal sinus rhythm, rate 73, axis within normal limits, low voltage in the limb and chest leads, poor anterior R wave progression, no change from previous.  Recent Labs: 06/08/2018: Hemoglobin 14.7; Platelets 254; TSH 1.140    Lipid Panel No results found for: CHOL, TRIG, HDL, CHOLHDL, VLDL, LDLCALC, LDLDIRECT    Wt Readings from Last 3 Encounters:  11/23/18 142 lb (64.4 kg)  05/25/18 136 lb (61.7 kg)  07/20/17 140 lb (63.5 kg)      Other studies Reviewed: Additional studies/ records that were reviewed today include:  None Review of the above records demonstrates:  Please see elsewhere in the note.     ASSESSMENT AND PLAN:  PALPITATIONS:    This is much improved.  She will continue the meds as listed.  No change in therapy.  ABNORMAL EKG:    Her EKG is unchanged from previous.   She is had thorough evaluation.  No change in therapy.   POSSIBLE SLEEP APNEA:   She has a markedly elevated Epworth scale and some snoring.  She has episodes of somnolence.  She does have some headaches.  She needs to be screened for probable sleep apnea.    Current medicines are reviewed at length with the patient today.  The patient does not have concerns regarding medicines.  The following changes have been made:  None  Labs/ tests ordered today include:    Orders Placed This Encounter  Procedures  . Ambulatory referral to Sleep Studies  . EKG 12-Lead     Disposition:   Follow up in one year.    Signed, Minus Breeding, MD  11/23/2018 4:02 PM    [facility]

## 2018-11-23 ENCOUNTER — Encounter: Payer: Self-pay | Admitting: Cardiology

## 2018-11-23 ENCOUNTER — Ambulatory Visit: Payer: 59 | Admitting: Cardiology

## 2018-11-23 ENCOUNTER — Other Ambulatory Visit: Payer: Self-pay

## 2018-11-23 VITALS — BP 103/66 | HR 73 | Temp 98.6°F | Ht 63.5 in | Wt 142.0 lb

## 2018-11-23 DIAGNOSIS — R002 Palpitations: Secondary | ICD-10-CM | POA: Diagnosis not present

## 2018-11-23 DIAGNOSIS — R9431 Abnormal electrocardiogram [ECG] [EKG]: Secondary | ICD-10-CM

## 2018-11-23 DIAGNOSIS — G4730 Sleep apnea, unspecified: Secondary | ICD-10-CM

## 2018-11-23 NOTE — Patient Instructions (Signed)
Medication Instructions:  Your physician recommends that you continue on your current medications as directed. Please refer to the Current Medication list given to you today.  If you need a refill on your cardiac medications before your next appointment, please call your pharmacy.   Lab work: NONE  Testing/Procedures: NONE  Follow-Up: At [facility], you and your health needs are our priority.  As part of our continuing mission to provide you with exceptional heart care, we have created designated Provider Care Teams.  These Care Teams include your primary Cardiologist (physician) and Advanced Practice Providers (APPs -  Physician Assistants and Nurse Practitioners) who all work together to provide you with the care you need, when you need it. You may see Minus Breeding, MD or one of the following Advanced Practice Providers on your designated Care Team:    Rosaria Ferries, PA-C  Jory Sims, DNP, ANP  Cadence Kathlen Mody, NP   Your physician wants you to follow-up in: 1 year. You will receive a reminder letter in the mail two months in advance. If you don't receive a letter, please call our office to schedule the follow-up appointment.  Any Other Special Instructions Will Be Listed Below (If Applicable). You have been referred to Sleep Studies.

## 2018-12-23 ENCOUNTER — Other Ambulatory Visit: Payer: Self-pay | Admitting: Cardiology

## 2019-01-22 ENCOUNTER — Other Ambulatory Visit: Payer: Self-pay | Admitting: Cardiology

## 2019-01-22 NOTE — Telephone Encounter (Signed)
Rx(s) sent to pharmacy electronically.  

## 2019-04-11 DIAGNOSIS — M5126 Other intervertebral disc displacement, lumbar region: Secondary | ICD-10-CM | POA: Diagnosis not present

## 2019-04-11 DIAGNOSIS — M4125 Other idiopathic scoliosis, thoracolumbar region: Secondary | ICD-10-CM | POA: Diagnosis not present

## 2019-04-11 DIAGNOSIS — M47816 Spondylosis without myelopathy or radiculopathy, lumbar region: Secondary | ICD-10-CM | POA: Diagnosis not present

## 2019-04-11 DIAGNOSIS — M419 Scoliosis, unspecified: Secondary | ICD-10-CM | POA: Diagnosis not present

## 2019-04-11 DIAGNOSIS — Z6823 Body mass index (BMI) 23.0-23.9, adult: Secondary | ICD-10-CM | POA: Diagnosis not present

## 2019-04-22 DIAGNOSIS — N644 Mastodynia: Secondary | ICD-10-CM | POA: Diagnosis not present

## 2019-04-22 DIAGNOSIS — N951 Menopausal and female climacteric states: Secondary | ICD-10-CM | POA: Diagnosis not present

## 2019-06-04 DIAGNOSIS — Z79899 Other long term (current) drug therapy: Secondary | ICD-10-CM | POA: Diagnosis not present

## 2019-06-04 DIAGNOSIS — K9049 Malabsorption due to intolerance, not elsewhere classified: Secondary | ICD-10-CM | POA: Diagnosis not present

## 2019-07-12 DIAGNOSIS — J342 Deviated nasal septum: Secondary | ICD-10-CM | POA: Diagnosis not present

## 2019-07-12 DIAGNOSIS — J339 Nasal polyp, unspecified: Secondary | ICD-10-CM | POA: Diagnosis not present

## 2019-07-12 DIAGNOSIS — J343 Hypertrophy of nasal turbinates: Secondary | ICD-10-CM | POA: Diagnosis not present

## 2019-07-12 DIAGNOSIS — J302 Other seasonal allergic rhinitis: Secondary | ICD-10-CM | POA: Diagnosis not present

## 2019-07-15 ENCOUNTER — Other Ambulatory Visit: Payer: Self-pay | Admitting: *Deleted

## 2019-07-15 DIAGNOSIS — G4730 Sleep apnea, unspecified: Secondary | ICD-10-CM

## 2019-07-15 DIAGNOSIS — R0683 Snoring: Secondary | ICD-10-CM

## 2019-07-15 NOTE — Progress Notes (Signed)
am

## 2019-07-16 NOTE — Telephone Encounter (Signed)
LM  To call back ./cy

## 2019-07-18 NOTE — Telephone Encounter (Signed)
Spoke with pt and is taking the Propanolol 80 mg at 10:00 pm Per pt had this issue when she was 14 and had taken Atenolol 50 mg for 10 years  and this worked well Per pt has stopped drinking coffee and and no alcohol Will forward to Dr Percival Spanish for review and recommendations./cy

## 2019-07-19 NOTE — Telephone Encounter (Signed)
I spoke with the patient.  She needs a prescription for immediate release 10 mg propranolol which she can take as needed in addition to her extended release propranolol.  Please call in a prescription.

## 2019-07-22 MED ORDER — PROPRANOLOL HCL 10 MG PO TABS: 10.0000 mg | ORAL_TABLET | ORAL | 6 refills | Status: DC | PRN

## 2019-07-29 DIAGNOSIS — Z6824 Body mass index (BMI) 24.0-24.9, adult: Secondary | ICD-10-CM | POA: Diagnosis not present

## 2019-07-29 DIAGNOSIS — Z1151 Encounter for screening for human papillomavirus (HPV): Secondary | ICD-10-CM | POA: Diagnosis not present

## 2019-07-29 DIAGNOSIS — N951 Menopausal and female climacteric states: Secondary | ICD-10-CM | POA: Diagnosis not present

## 2019-07-29 DIAGNOSIS — Z01419 Encounter for gynecological examination (general) (routine) without abnormal findings: Secondary | ICD-10-CM | POA: Diagnosis not present

## 2019-08-06 ENCOUNTER — Other Ambulatory Visit: Payer: Self-pay | Admitting: Obstetrics

## 2019-08-06 ENCOUNTER — Encounter: Payer: 59 | Admitting: Physician Assistant

## 2019-08-06 DIAGNOSIS — Z1231 Encounter for screening mammogram for malignant neoplasm of breast: Secondary | ICD-10-CM

## 2019-08-07 ENCOUNTER — Encounter: Payer: Self-pay | Admitting: Physician Assistant

## 2019-08-12 ENCOUNTER — Ambulatory Visit: Payer: BC Managed Care – PPO | Admitting: Neurology

## 2019-08-12 ENCOUNTER — Encounter: Payer: Self-pay | Admitting: Neurology

## 2019-08-12 VITALS — BP 102/75 | HR 53 | Ht 64.0 in | Wt 141.0 lb

## 2019-08-12 DIAGNOSIS — R9431 Abnormal electrocardiogram [ECG] [EKG]: Secondary | ICD-10-CM

## 2019-08-12 DIAGNOSIS — R0609 Other forms of dyspnea: Secondary | ICD-10-CM

## 2019-08-12 DIAGNOSIS — R0689 Other abnormalities of breathing: Secondary | ICD-10-CM | POA: Insufficient documentation

## 2019-08-12 DIAGNOSIS — M4104 Infantile idiopathic scoliosis, thoracic region: Secondary | ICD-10-CM | POA: Insufficient documentation

## 2019-08-12 DIAGNOSIS — R0683 Snoring: Secondary | ICD-10-CM | POA: Insufficient documentation

## 2019-08-12 DIAGNOSIS — J342 Deviated nasal septum: Secondary | ICD-10-CM | POA: Insufficient documentation

## 2019-08-12 DIAGNOSIS — R002 Palpitations: Secondary | ICD-10-CM

## 2019-08-12 DIAGNOSIS — G4709 Other insomnia: Secondary | ICD-10-CM | POA: Insufficient documentation

## 2019-08-12 NOTE — Patient Instructions (Signed)
Quality Sleep Information, Adult Quality sleep is important for your mental and physical health. It also improves your quality of life. Quality sleep means you:  Are asleep for most of the time you are in bed.  Fall asleep within 30 minutes.  Wake up no more than once a night.  Are awake for no longer than 20 minutes if you do wake up during the night. Most adults need 7-8 hours of quality sleep each night. How can poor sleep affect me? If you do not get enough quality sleep, you may have:  Mood swings.  Daytime sleepiness.  Confusion.  Decreased reaction time.  Sleep disorders, such as insomnia and sleep apnea.  Difficulty with: ? Solving problems. ? Coping with stress. ? Paying attention. These issues may affect your performance and productivity at work, school, and at home. Lack of sleep may also put you at higher risk for accidents, suicide, and risky behaviors. If you do not get quality sleep you may also be at higher risk for several health problems, including:  Infections.  Type 2 diabetes.  Heart disease.  High blood pressure.  Obesity.  Worsening of long-term conditions, like arthritis, kidney disease, depression, Parkinson's disease, and epilepsy. What actions can I take to get more quality sleep?      Stick to a sleep schedule. Go to sleep and wake up at about the same time each day. Do not try to sleep less on weekdays and make up for lost sleep on weekends. This does not work.  Try to get about 30 minutes of exercise on most days. Do not exercise 2-3 hours before going to bed.  Limit naps during the day to 30 minutes or less.  Do not use any products that contain nicotine or tobacco, such as cigarettes or e-cigarettes. If you need help quitting, ask your health care provider.  Do not drink caffeinated beverages for at least 8 hours before going to bed. Coffee, tea, and some sodas contain caffeine.  Do not drink alcohol close to bedtime.  Do not  eat large meals close to bedtime.  Do not take naps in the late afternoon.  Try to get at least 30 minutes of sunlight every day. Morning sunlight is best.  Make time to relax before bed. Reading, listening to music, or taking a hot bath promotes quality sleep.  Make your bedroom a place that promotes quality sleep. Keep your bedroom dark, quiet, and at a comfortable room temperature. Make sure your bed is comfortable. Take out sleep distractions like TV, a computer, smartphone, and bright lights.  If you are lying awake in bed for longer than 20 minutes, get up and do a relaxing activity until you feel sleepy.  Work with your health care provider to treat medical conditions that may affect sleeping, such as: ? Nasal obstruction. ? Snoring. ? Sleep apnea and other sleep disorders.  Talk to your health care provider if you think any of your prescription medicines may cause you to have difficulty falling or staying asleep.  If you have sleep problems, talk with a sleep consultant. If you think you have a sleep disorder, talk with your health care provider about getting evaluated by a specialist. Where to find more information  National Sleep Foundation website: https://sleepfoundation.org  National Heart, Lung, and Blood Institute (NHLBI): www.nhlbi.nih.gov/files/docs/public/sleep/healthy_sleep.pdf  Centers for Disease Control and Prevention (CDC): www.cdc.gov/sleep/index.html Contact a health care provider if you:  Have trouble getting to sleep or staying asleep.  Often wake up   very early in the morning and cannot get back to sleep.  Have daytime sleepiness.  Have daytime sleep attacks of suddenly falling asleep and sudden muscle weakness (narcolepsy).  Have a tingling sensation in your legs with a strong urge to move your legs (restless legs syndrome).  Stop breathing briefly during sleep (sleep apnea).  Think you have a sleep disorder or are taking a medicine that is  affecting your quality of sleep. Summary  Most adults need 7-8 hours of quality sleep each night.  Getting enough quality sleep is an important part of health and well-being.  Make your bedroom a place that promotes quality sleep and avoid things that may cause you to have poor sleep, such as alcohol, caffeine, smoking, and large meals.  Talk to your health care provider if you have trouble falling asleep or staying asleep. This information is not intended to replace advice given to you by your health care provider. Make sure you discuss any questions you have with your health care provider. Document Revised: 03/29/2017 Document Reviewed: 03/29/2017 Elsevier Patient Education  2020 [employer] Insomnia Insomnia is a sleep disorder that makes it difficult to fall asleep or stay asleep. Insomnia can cause fatigue, low energy, difficulty concentrating, mood swings, and poor performance at work or school. There are three different ways to classify insomnia:  Difficulty falling asleep.  Difficulty staying asleep.  Waking up too early in the morning. Any type of insomnia can be long-term (chronic) or short-term (acute). Both are common. Short-term insomnia usually lasts for three months or less. Chronic insomnia occurs at least three times a week for longer than three months. What are the causes? Insomnia may be caused by another condition, situation, or substance, such as:  Anxiety.  Certain medicines.  Gastroesophageal reflux disease (GERD) or other gastrointestinal conditions.  Asthma or other breathing conditions.  Restless legs syndrome, sleep apnea, or other sleep disorders.  Chronic pain.  Menopause.  Stroke.  Abuse of alcohol, tobacco, or illegal drugs.  Mental health conditions, such as depression.  Caffeine.  Neurological disorders, such as Alzheimer's disease.  An overactive thyroid (hyperthyroidism). Sometimes, the cause of insomnia may not be known. What  increases the risk? Risk factors for insomnia include:  Gender. Women are affected more often than men.  Age. Insomnia is more common as you get older.  Stress.  Lack of exercise.  Irregular work schedule or working night shifts.  Traveling between different time zones.  Certain medical and mental health conditions. What are the signs or symptoms? If you have insomnia, the main symptom is having trouble falling asleep or having trouble staying asleep. This may lead to other symptoms, such as:  Feeling fatigued or having low energy.  Feeling nervous about going to sleep.  Not feeling rested in the morning.  Having trouble concentrating.  Feeling irritable, anxious, or depressed. How is this diagnosed? This condition may be diagnosed based on:  Your symptoms and medical history. Your health care provider may ask about: ? Your sleep habits. ? Any medical conditions you have. ? Your mental health.  A physical exam. How is this treated? Treatment for insomnia depends on the cause. Treatment may focus on treating an underlying condition that is causing insomnia. Treatment may also include:  Medicines to help you sleep.  Counseling or therapy.  Lifestyle adjustments to help you sleep better. Follow these instructions at home: Eating and drinking   Limit or avoid alcohol, caffeinated beverages, and cigarettes, especially close to bedtime.   These can disrupt your sleep.  Do not eat a large meal or eat spicy foods right before bedtime. This can lead to digestive discomfort that can make it hard for you to sleep. Sleep habits   Keep a sleep diary to help you and your health care provider figure out what could be causing your insomnia. Write down: ? When you sleep. ? When you wake up during the night. ? How well you sleep. ? How rested you feel the next day. ? Any side effects of medicines you are taking. ? What you eat and drink.  Make your bedroom a dark,  comfortable place where it is easy to fall asleep. ? Put up shades or blackout curtains to block light from outside. ? Use a white noise machine to block noise. ? Keep the temperature cool.  Limit screen use before bedtime. This includes: ? Watching TV. ? Using your smartphone, tablet, or computer.  Stick to a routine that includes going to bed and waking up at the same times every day and night. This can help you fall asleep faster. Consider making a quiet activity, such as reading, part of your nighttime routine.  Try to avoid taking naps during the day so that you sleep better at night.  Get out of bed if you are still awake after 15 minutes of trying to sleep. Keep the lights down, but try reading or doing a quiet activity. When you feel sleepy, go back to bed. General instructions  Take over-the-counter and prescription medicines only as told by your health care provider.  Exercise regularly, as told by your health care provider. Avoid exercise starting several hours before bedtime.  Use relaxation techniques to manage stress. Ask your health care provider to suggest some techniques that may work well for you. These may include: ? Breathing exercises. ? Routines to release muscle tension. ? Visualizing peaceful scenes.  Make sure that you drive carefully. Avoid driving if you feel very sleepy.  Keep all follow-up visits as told by your health care provider. This is important. Contact a health care provider if:  You are tired throughout the day.  You have trouble in your daily routine due to sleepiness.  You continue to have sleep problems, or your sleep problems get worse. Get help right away if:  You have serious thoughts about hurting yourself or someone else. If you ever feel like you may hurt yourself or others, or have thoughts about taking your own life, get help right away. You can go to your nearest emergency department or call:  Your local emergency services (911  in the [place]  A suicide crisis helpline, such as the National Suicide Prevention Lifeline at 1-800-273-8255. This is open 24 hours a day. Summary  Insomnia is a sleep disorder that makes it difficult to fall asleep or stay asleep.  Insomnia can be long-term (chronic) or short-term (acute).  Treatment for insomnia depends on the cause. Treatment may focus on treating an underlying condition that is causing insomnia.  Keep a sleep diary to help you and your health care provider figure out what could be causing your insomnia. This information is not intended to replace advice given to you by your health care provider. Make sure you discuss any questions you have with your health care provider. Document Revised: 12/02/2016 Document Reviewed: 09/29/2016 Elsevier Patient Education  2020 [employer] Inc.  

## 2019-08-12 NOTE — Progress Notes (Signed)
SLEEP MEDICINE CLINIC    Provider:  Larey Seat, MD  Primary Care Physician:  London Pepper, MD [address] [place] [address]     Referring Provider: Dr. Percival Spanish, MD         Chief Complaint according to patient   Patient presents with:    . New Patient (Initial Visit)     pt describes not sleeping well anymore. she has been having heart palpitations. she has been doing better about falling asleep, she just can't stay asleep. averages about 5-6 hours of sleep. has never had a SS.       HISTORY OF PRESENT ILLNESS:  Cynthia Nguyen, DMD  is a 49 Year- old  Female of [place] descent  patient seen here as a referral on 08/12/2019 from dr Percival Spanish  for a sleep work up.   Chief concern according to patient : " I have palpitations all day long. When I get enough sleep the palpitations are less noticeable. I take beta blockers and melatonin to help fall asleep".  "I now have to avoid all caffeine and ETOH".    I have the pleasure of seeing Cynthia Nguyen today, a right -handed Other or two or more races female with a possible sleep disorder. She  has a past medical history of Fibrocystic breast disease, Generalized anxiety disorder, Mitral valve prolapse, Scoliosis, and Seasonal allergies.  she has scoliosis, no surgical treatment.  Sleep relevant medical history: palpitations, insomnia. Averaging 5.5 hours of sleep per fit bit. No Tonsillectomy but large tonsils- snoring . A deviated septum was found by Dr Wilburn Cornelia.     Family medical /sleep history:  Mother with insomnia, and RLS.  her insomnia started after her husband passed away.  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 Living with mother and partner. The patient currently works in her own office. Pets are present, 2 small dogs. . Tobacco use- none.  ETOH use - rarely , Caffeine intake is much reduced -  Regular exercise in form of swimming- helps with scoliosis. Some walking, some weight lifting.     Sleep habits are as follows:  The patient's dinner time is between 5-6  PM. The patient goes to bed at 10.30 PM and asleep by 11 PM- she continues to sleep for 5-6 hours, wakes the first time at 3-4 AM.   The preferred sleep position is prone , with the support of no pillows.  Dreams are reportedly infrequent. 6.30  AM is the usual rise time. The patient wakes up spontaneously at 4- often that's the end of sleep. Often with palpitations.  She reports not feeling refreshed or restored in AM, with symptoms such as dry mouth, nasal congestion,  headaches in daytime-and residual fatigue.  Naps are taken frequently, but she is doing less of it- lasting from 2-3 hours minutes and are more refreshing than nocturnal sleep. They make sleep the following night almost impossibel.    Review of Systems: Out of a complete 14 system review, the patient complains of only the following symptoms, and all other reviewed systems are negative.:  Fatigue, sleepiness , snoring, palpitations.  Lives with same sex partner.  fragmented sleep, Insomnia- early arousals. Scoliosis and back pain-    How likely are you to doze in the following situations: 0 = not likely, 1 = slight chance, 2 = moderate chance,  3 = high chance   Sitting and Reading? Watching Television? Sitting inactive in a public place (theater or meeting)? As a passenger in a car for an hour without a break? Lying down in the afternoon when circumstances permit? Sitting and talking to someone? Sitting quietly after lunch without alcohol? In a car, while stopped for a few minutes in traffic?   Total = 11/ 24 points   FSS endorsed at 40/ 63 points.   struggles with anxiety but not depression.   Social History   Socioeconomic History  . Marital status: Single    Spouse name: Not on file  . Number of children: Not on file  . Years of education:  Not on file  . Highest education level: Not on file  Occupational History  . 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 Smoking status: Never Smoker  . Smokeless tobacco: Never Used  Substance and Sexual Activity  . Alcohol use: Yes    Alcohol/week: 2.0 standard drinks    Types: 2 Standard drinks or equivalent per week    Comment: couple per week.   . Drug use: No  . Sexual activity: Not on file  Other Topics Concern  . Not on file  Social History Narrative  . Not on file   Social Determinants of Health   Financial Resource Strain:   . Difficulty of Paying Living Expenses:   Food Insecurity:   . Worried About Charity fundraiser in the Last Year:   . Arboriculturist in the Last Year:   Transportation Needs:   . Film/video editor (Medical):   Marland Kitchen Lack of Transportation (Non-Medical):   Physical Activity:   . Days of Exercise per Week:   . Minutes of Exercise per Session:   Stress:   . Feeling of Stress :   Social Connections:   . Frequency of Communication with Friends and Family:   . Frequency of Social Gatherings with Friends and Family:   . Attends Religious Services:   . Active Member of Clubs or Organizations:   . Attends Archivist Meetings:   Marland Kitchen Marital Status:     Family History  Problem Relation Age of Onset  . Atrial fibrillation Father   . Diabetes Father   . Colon cancer Father   . Colon polyps Mother   . Hypertension Mother   . Emphysema Paternal Uncle   . Stomach cancer Neg Hx   . Rectal cancer Neg Hx     Past Medical History:  Diagnosis Date  . Fibrocystic breast disease   . Generalized anxiety disorder   . Mitral valve prolapse    childhood  . Scoliosis    per pt >60% curvature  . Seasonal allergies     Past Surgical History:  Procedure Laterality Date  . BREAST CYST ASPIRATION  08/16/2010  . BREAST CYST ASPIRATION  12/25/2009  . BREAST CYST ASPIRATION  07/22/2009  . WISDOM TOOTH EXTRACTION        Current Outpatient Medications on File Prior to Visit  Medication Sig Dispense Refill  . calcium carbonate (CALCIUM 600) 600 MG TABS tablet Take 600 mg by mouth 2 (two) times daily with a meal.     . Cholecalciferol (VITAMIN D3) 50 MCG (2000 UT) TABS Take 1 tablet by mouth daily.    Marland Kitchen estradiol (VIVELLE-DOT) 0.1 MG/24HR patch Place 1 patch onto the skin 2 (two) times a week.    . fluticasone (  FLONASE) 50 MCG/ACT nasal spray USE 2 SPRAY(S) IN EACH NOSTRIL ONCE DAILY    . PROGESTERONE PO Take 50 mg by mouth.     . propranolol (INDERAL) 10 MG tablet Take 1 tablet (10 mg total) by mouth as needed. 30 tablet 6  . propranolol ER (INDERAL LA) 80 MG 24 hr capsule TAKE ONE CAPSULE BY MOUTH DAILY AROUND DINNERT IME 30 capsule 10   No current facility-administered medications on file prior to visit.    Allergies  Allergen Reactions  . Cephalexin     dizziness    Physical exam:  Today's Vitals   08/12/19 1042  BP: 102/75  Pulse: (!) 53  Weight: 141 lb (64 kg)  Height: 5\' 4"  (1.626 m)   Body mass index is 24.2 kg/m.   Wt Readings from Last 3 Encounters:  08/12/19 141 lb (64 kg)  11/23/18 142 lb (64.4 kg)  05/25/18 136 lb (61.7 kg)     Ht Readings from Last 3 Encounters:  08/12/19 5\' 4"  (1.626 m)  11/23/18 5' 3.5" (1.613 m)  05/25/18 5\' 4"  (1.626 m)      General: The patient is awake, alert and appears not in acute distress. The patient is well groomed. Head: Normocephalic, atraumatic. Neck is supple. Mallampati 2- left tonsil is much more enlarged.   neck circumference:14 inches . Nasal airflow  patent.  Retrognathia is not seen.  Dental status: intact  Cardiovascular:  Regular rate and cardiac rhythm by pulse,  without distended neck veins. Respiratory: Lungs are clear to auscultation.  Skin:  Without evidence of ankle edema, or rash. Trunk: The patient's posture is erect.   Neurologic exam : The patient is awake and alert, oriented to place and time.   Memory subjective  described as intact.  Attention span & concentration ability appears normal.  Speech is fluent,   without  dysarthria, dysphonia or aphasia.  Mood and affect are appropriate.   Cranial nerves: no loss of smell or taste reported  Pupils are equal and briskly reactive to light. Funduscopic exam without palor or scars, no edema.  Extraocular movements in vertical and horizontal planes were intact and without nystagmus. No Diplopia. Visual fields by finger perimetry are intact. Hearing was intact to soft voice and finger rubbing.   Facial sensation intact to fine touch.  Facial motor strength is symmetric and tongue and uvula move midline.  Neck ROM : rotation, tilt and flexion extension were normal for age and shoulder shrug was symmetrical.    Motor exam:  Symmetric bulk, tone and ROM.   Normal tone without cog -wheeling, symmetric grip strength .   Sensory:  Fine touch, pinprick and vibration were tested  and  normal.  Proprioception tested in the upper extremities was normal.   Coordination: Rapid alternating movements in the fingers/hands were of normal speed.  The Finger-to-nose maneuver was intact without evidence of ataxia, dysmetria or tremor.   Gait and station: Patient could rise unassisted from a seated position, walked without assistive device.  Stance is of normal width/ base and the patient turned with 3 steps.  Toe and heel walk were deferred.  Deep tendon reflexes: in the  upper and lower extremities are symmetric and intact.  Babinski response was up going- she is very ticklish.        After spending a total time of 45 minutes face to face and additional time for physical and neurologic examination, review of laboratory studies,  personal review of imaging studies, reports  and results of other testing and review of referral information / records as far as provided in visit, I have established the following assessments:  1)  Insomnia has improved - with the help of  progesterone, and by having quit long naps, caffeine and alcohol.  2)  Nasal septal deviation and tonsil enlargement .  3)  Waking gasping - short of breath.  4)  Scoliosis seems to impact thoracic wall movements    My Plan is to proceed with:  1) patient has already used a fit bit- smart watch- we need to look at cardiac rhythm , not just rate and correlate to hypopnea/ apnea.  2)  PSG is needed.    I would like to thank Dr Percival Spanish  and London Pepper, [place] [address] [place] [place],  [place] [address] for allowing me to meet with and to take care of this pleasant patient.   In short, Cynthia Nguyen is presenting with palpitations, thoracic wall restriction and large tonsils -  We need to correlat the pal[itation to a true cardiac rhythm.  Patient will stay on propanolol.  I plan to follow up either personally or through our NP within 2-3 month.     Electronically signed by: Larey Seat, MD 08/12/2019 10:59 AM  [facility] Board certified by The [employer] of Sleep Medicine and Diplomate of the [employer] of Sleep Medicine. Board certified In Neurology through the Ali Chuk, Fellow of the [employer] of Neurology. Medical Director of [facility].

## 2019-08-16 ENCOUNTER — Ambulatory Visit
Admission: RE | Admit: 2019-08-16 | Discharge: 2019-08-16 | Disposition: A | Discharge status: Home / Self Care | Payer: BC Managed Care – PPO | Source: Ambulatory Visit | Attending: Obstetrics | Admitting: Obstetrics

## 2019-08-16 ENCOUNTER — Other Ambulatory Visit: Payer: Self-pay

## 2019-08-16 DIAGNOSIS — Z1231 Encounter for screening mammogram for malignant neoplasm of breast: Secondary | ICD-10-CM

## 2019-08-20 ENCOUNTER — Telehealth: Payer: Self-pay

## 2019-08-20 NOTE — Telephone Encounter (Signed)
LVM for pt to call me back to schedule sleep study  

## 2019-08-21 ENCOUNTER — Other Ambulatory Visit: Payer: Self-pay | Admitting: Obstetrics

## 2019-08-21 DIAGNOSIS — R928 Other abnormal and inconclusive findings on diagnostic imaging of breast: Secondary | ICD-10-CM

## 2019-09-02 ENCOUNTER — Ambulatory Visit (INDEPENDENT_AMBULATORY_CARE_PROVIDER_SITE_OTHER): Payer: BC Managed Care – PPO | Admitting: Neurology

## 2019-09-02 DIAGNOSIS — R002 Palpitations: Secondary | ICD-10-CM

## 2019-09-02 DIAGNOSIS — R0683 Snoring: Secondary | ICD-10-CM

## 2019-09-02 DIAGNOSIS — R0609 Other forms of dyspnea: Secondary | ICD-10-CM

## 2019-09-02 DIAGNOSIS — G4752 REM sleep behavior disorder: Secondary | ICD-10-CM | POA: Diagnosis not present

## 2019-09-02 DIAGNOSIS — R0689 Other abnormalities of breathing: Secondary | ICD-10-CM

## 2019-09-02 DIAGNOSIS — G4709 Other insomnia: Secondary | ICD-10-CM

## 2019-09-02 DIAGNOSIS — R9431 Abnormal electrocardiogram [ECG] [EKG]: Secondary | ICD-10-CM

## 2019-09-03 ENCOUNTER — Other Ambulatory Visit: Payer: Self-pay

## 2019-09-03 ENCOUNTER — Ambulatory Visit
Admission: RE | Admit: 2019-09-03 | Discharge: 2019-09-03 | Disposition: A | Discharge status: Home / Self Care | Payer: BC Managed Care – PPO | Source: Ambulatory Visit | Attending: Obstetrics | Admitting: Obstetrics

## 2019-09-03 ENCOUNTER — Ambulatory Visit: Payer: BC Managed Care – PPO

## 2019-09-03 DIAGNOSIS — R928 Other abnormal and inconclusive findings on diagnostic imaging of breast: Secondary | ICD-10-CM

## 2019-09-03 DIAGNOSIS — R922 Inconclusive mammogram: Secondary | ICD-10-CM | POA: Diagnosis not present

## 2019-09-15 NOTE — Progress Notes (Signed)
IMPRESSION:   1. No clinically significant Obstructive Sleep Apnea (OSA)  2. No evidence of Periodic Limb Movement Disorder (PLMD)  3. Dysfunctions associated with arousal from REM sleep- The  patient wakes directly out of REM sleep; EEG changes are noted  without any other physiological trigger or cause of arousal.    RECOMMENDATIONS: Frequent spontaneous arousals are noted-some out of REM  sleep, none with another correlated physiological trigger.  Most common cause of spontaneous arousals is anxiety or  depression, followed by discomfort or pain. Treatment with SSRi,  Tricyclic medication and sleep aids can be of help.

## 2019-09-15 NOTE — Procedures (Signed)
PATIENT'S NAME:  Cynthia Nguyen, DDS DOB:      May 27, 1970      MR#:    [id number]     DATE OF RECORDING: 09/02/2019 [facility] REFERRING M.D.:  Percival Spanish, MD and London Pepper MD Study Performed:   Baseline Polysomnogram HISTORY:  This 49 year- old female patient of [place] descent and local dentist was seen here upon referral on 08/12/2019 from Dr Percival Spanish for a sleep work up.   Chief concern according to patient: " I have palpitations all day long. When I get enough sleep, the palpitations are less noticeable. I take beta blockers and melatonin to help fall asleep".  "I now have to avoid all caffeine and ETOH".   I have the pleasure of seeing Dr. [name] today, a right -handed patient with a  medical history of Fibrocystic breast disease, Generalized anxiety disorder, Mitral valve prolapse, Scoliosis, and Seasonal allergies. Sleep relevant medical history: palpitations, insomnia. Averaging 5.5 hours of sleep per fit bit. Having large tonsils- and is reportedly snoring. A deviated septum was found by Dr. Wilburn Cornelia.  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     6.30 AM is the usual rise time. The patient wakes up spontaneously at 4 AM- often that's the end of sleep. Often with palpitations.  She reports not feeling refreshed or restored in AM, with symptoms such as dry mouth, nasal congestion, headaches in daytime-and residual fatigue. She avoids daytime naps.  The patient endorsed the Epworth Sleepiness Scale at 11 points.   The patient's weight 141 pounds with a height of 64 (inches), resulting in a BMI of 24.1 kg/m2. The patient's neck circumference measured 14 inches.  CURRENT MEDICATIONS: Flonase, Progesterone, Inderal   PROCEDURE:  This is a multichannel digital polysomnogram utilizing the Somnostar 11.2 system.  Electrodes and sensors were applied and monitored per AASM Specifications.   EEG, EOG, Chin and  Limb EMG, were sampled at 200 Hz.  ECG, Snore and Nasal Pressure, Thermal Airflow, Respiratory Effort, CPAP Flow and Pressure, Oximetry was sampled at 50 Hz. Digital video and audio were recorded.      BASELINE STUDY: Lights Out was at 22:44 and Lights On at 05:08.  Total recording time (TRT) was 385 minutes, with a total sleep time (TST) of 324.5 minutes.   The patient's sleep latency was 39 minutes.  REM latency was 58 minutes.  The sleep efficiency was 84.3 %.     SLEEP ARCHITECTURE: WASO (Wake after sleep onset) was 39.5 minutes.  There were 28.5 minutes in Stage N1, 54.5 minutes Stage N2, 146.5 minutes Stage N3 and 95 minutes in Stage REM.  The percentage of Stage N1 was 8.8%, Stage N2 was 16.8%, Stage N3 was 45.1% and Stage R (REM sleep) was 29.3%.  Notable was the high dream sleep proportion and that the patient woke up after 4 of 5 REM cycles, briefly- but clearly precipitated by REM.   RESPIRATORY ANALYSIS:  There were a total of 9 respiratory events:  0 obstructive apneas, 0 central apneas and 0 mixed apneas with a total of 0 apneas and an apnea index (AI) of 0 /hour. There were 9 hypopneas with a hypopnea index of 1.7 /hour.  The total APNEA/HYPOPNEA INDEX (AHI) was 1.7/hour.  9 events occurred in REM sleep and 0 events in NREM. The REM AHI was  5.7 /hour, versus a non-REM AHI of 0.  The patient spent 256.5 minutes of total sleep time in the supine position and 68 minutes in non-supine. The supine AHI was 2.1/h versus a non-supine AHI of 0.0/h.  OXYGEN SATURATION & C02:  The Wake baseline 02 saturation was 96%, with the lowest being 85%. Time spent below 89% saturation equaled only 3 minutes.  The arousals were noted as: 61 were spontaneous, 0 were associated with PLMs, 2 were associated with respiratory events. The patient had a total of 0 Periodic Limb Movements.   Audio and video analysis did not show any abnormal or unusual movements, behaviors, phonations or vocalizations.   No  significant Snoring was noted, only soft, intermittent audible breathing.  EKG was in keeping with normal sinus rhythm (NSR). Isolated PVCs. Please see REM sleep and arousal in screenshot attached.   IMPRESSION:  1. No clinically significant Obstructive Sleep Apnea (OSA) 2. No evidence of Periodic Limb Movement Disorder (PLMD) 3. Dysfunctions associated with arousal from REM sleep- The patient wakes directly out of REM sleep; EEG changes are noted without any other physiological trigger or cause of arousal.    RECOMMENDATIONS: Frequent spontaneous arousal -some out of REM sleep, none with another physiological trigger. Most common cause of spontaneous arousals is anxiety or depression, followed by discomfort or pain. Treatment with SSRi, Tricyclic medication and sleep aids can be of help.  I certify that I have reviewed the entire raw data recording prior to the issuance of this report in accordance with the Standards of Accreditation of the American Academy of Sleep Medicine (AASM) Larey Seat, MD Diplomat, American Board of Psychiatry and Neurology  Diplomat, American Board of Sleep Medicine Market researcher, [facility] [employer]

## 2019-09-16 ENCOUNTER — Telehealth: Payer: Self-pay

## 2019-09-16 NOTE — Telephone Encounter (Signed)
-----   Message from Larey Seat, MD sent at 09/15/2019  4:43 PM EDT ----- IMPRESSION:   1. No clinically significant Obstructive Sleep Apnea (OSA)  2. No evidence of Periodic Limb Movement Disorder (PLMD)  3. Dysfunctions associated with arousal from REM sleep- The  patient wakes directly out of REM sleep; EEG changes are noted  without any other physiological trigger or cause of arousal.    RECOMMENDATIONS: Frequent spontaneous arousals are noted-some out of REM  sleep, none with another correlated physiological trigger.  Most common cause of spontaneous arousals is anxiety or  depression, followed by discomfort or pain. Treatment with SSRi,  Tricyclic medication and sleep aids can be of help.

## 2019-09-16 NOTE — Telephone Encounter (Signed)
called patient back :  Regarding possible anxiety related arousals. We discussed treatment with SSRi  (or cymbalta if pain plays a role in it)- patient will contact PCP.  Larey Seat, MD .

## 2019-09-16 NOTE — Telephone Encounter (Signed)
I called pt. I discussed her sleep study results with her. She has questions about the EEG changes and if there were changes in the EKG when she woke up. She would like a call from Dr. Brett Fairy.

## 2019-10-22 ENCOUNTER — Telehealth: Payer: Self-pay | Admitting: Neurology

## 2019-10-22 NOTE — Telephone Encounter (Signed)
Patient wanted to let the nurse know she contacted the [facility] people and has not heard anything back from them. If needed, her best call back is [phone]

## 2019-10-22 NOTE — Telephone Encounter (Signed)
Called the patient back and she was calling in regards to her mom. I will go to mothers chart and document the call.

## 2019-10-24 NOTE — Telephone Encounter (Signed)
Called to question if her apt for Monday is still needed? (Also called to push out her mom's apt scheduled on same day)

## 2019-10-28 ENCOUNTER — Ambulatory Visit: Payer: BC Managed Care – PPO | Admitting: Neurology

## 2019-10-28 ENCOUNTER — Encounter: Payer: Self-pay | Admitting: Neurology

## 2019-10-28 ENCOUNTER — Other Ambulatory Visit: Payer: Self-pay

## 2019-10-28 VITALS — BP 100/69 | HR 59 | Ht 64.0 in | Wt 143.5 lb

## 2019-10-28 DIAGNOSIS — G4700 Insomnia, unspecified: Secondary | ICD-10-CM | POA: Insufficient documentation

## 2019-10-28 DIAGNOSIS — G478 Other sleep disorders: Secondary | ICD-10-CM | POA: Insufficient documentation

## 2019-10-28 MED ORDER — TRAZODONE HCL 50 MG PO TABS: ORAL_TABLET | ORAL | 5 refills | Status: DC

## 2019-10-28 NOTE — Patient Instructions (Signed)
Trazodone tablets What is this medicine? TRAZODONE (TRAZ oh done) is used to treat depression. This medicine may be used for other purposes; ask your health care provider or pharmacist if you have questions. COMMON BRAND NAME(S): Desyrel What should I tell my health care provider before I take this medicine? They need to know if you have any of these conditions:  attempted suicide or thinking about it  bipolar disorder  bleeding problems  glaucoma  heart disease, or previous heart attack  irregular heart beat  kidney or liver disease  low levels of sodium in the blood  an unusual or allergic reaction to trazodone, other medicines, foods, dyes or preservatives  pregnant or trying to get pregnant  breast-feeding How should I use this medicine? Take this medicine by mouth with a glass of water. Follow the directions on the prescription label. Take this medicine shortly after a meal or a light snack. Take your medicine at regular intervals. Do not take your medicine more often than directed. Do not stop taking this medicine suddenly except upon the advice of your doctor. Stopping this medicine too quickly may cause serious side effects or your condition may worsen. A special MedGuide will be given to you by the pharmacist with each prescription and refill. Be sure to read this information carefully each time. Talk to your pediatrician regarding the use of this medicine in children. Special care may be needed. Overdosage: If you think you have taken too much of this medicine contact a poison control center or emergency room at once. NOTE: This medicine is only for you. Do not share this medicine with others. What if I miss a dose? If you miss a dose, take it as soon as you can. If it is almost time for your next dose, take only that dose. Do not take double or extra doses. What may interact with this medicine? Do not take this medicine with any of the following  medications:  certain medicines for fungal infections like fluconazole, itraconazole, ketoconazole, posaconazole, voriconazole  cisapride  dronedarone  linezolid  MAOIs like Carbex, Eldepryl, Marplan, Nardil, and Parnate  mesoridazine  methylene blue (injected into a vein)  pimozide  saquinavir  thioridazine This medicine may also interact with the following medications:  alcohol  antiviral medicines for HIV or AIDS  aspirin and aspirin-like medicines  barbiturates like phenobarbital  certain medicines for blood pressure, heart disease, irregular heart beat  certain medicines for depression, anxiety, or psychotic disturbances  certain medicines for migraine headache like almotriptan, eletriptan, frovatriptan, naratriptan, rizatriptan, sumatriptan, zolmitriptan  certain medicines for seizures like carbamazepine and phenytoin  certain medicines for sleep  certain medicines that treat or prevent blood clots like dalteparin, enoxaparin, warfarin  digoxin  fentanyl  lithium  NSAIDS, medicines for pain and inflammation, like ibuprofen or naproxen  other medicines that prolong the QT interval (cause an abnormal heart rhythm) like dofetilide  rasagiline  supplements like St. John's wort, kava kava, valerian  tramadol  tryptophan This list may not describe all possible interactions. Give your health care provider a list of all the medicines, herbs, non-prescription drugs, or dietary supplements you use. Also tell them if you smoke, drink alcohol, or use illegal drugs. Some items may interact with your medicine. What should I watch for while using this medicine? Tell your doctor if your symptoms do not get better or if they get worse. Visit your doctor or health care professional for regular checks on your progress. Because it may take   several weeks to see the full effects of this medicine, it is important to continue your treatment as prescribed by your  doctor. Patients and their families should watch out for new or worsening thoughts of suicide or depression. Also watch out for sudden changes in feelings such as feeling anxious, agitated, panicky, irritable, hostile, aggressive, impulsive, severely restless, overly excited and hyperactive, or not being able to sleep. If this happens, especially at the beginning of treatment or after a change in dose, call your health care professional. You may get drowsy or dizzy. Do not drive, use machinery, or do anything that needs mental alertness until you know how this medicine affects you. Do not stand or sit up quickly, especially if you are an older patient. This reduces the risk of dizzy or fainting spells. Alcohol may interfere with the effect of this medicine. Avoid alcoholic drinks. This medicine may cause dry eyes and blurred vision. If you wear contact lenses you may feel some discomfort. Lubricating drops may help. See your eye doctor if the problem does not go away or is severe. Your mouth may get dry. Chewing sugarless gum, sucking hard candy and drinking plenty of water may help. Contact your doctor if the problem does not go away or is severe. What side effects may I notice from receiving this medicine? Side effects that you should report to your doctor or health care professional as soon as possible:  allergic reactions like skin rash, itching or hives, swelling of the face, lips, or tongue  elevated mood, decreased need for sleep, racing thoughts, impulsive behavior  confusion  fast, irregular heartbeat  feeling faint or lightheaded, falls  feeling agitated, angry, or irritable  loss of balance or coordination  painful or prolonged erections  restlessness, pacing, inability to keep still  suicidal thoughts or other mood changes  tremors  trouble sleeping  seizures  unusual bleeding or bruising Side effects that usually do not require medical attention (report to your doctor  or health care professional if they continue or are bothersome):  change in sex drive or performance  change in appetite or weight  constipation  headache  muscle aches or pains  nausea This list may not describe all possible side effects. Call your doctor for medical advice about side effects. You may report side effects to FDA at 1-800-FDA-1088. Where should I keep my medicine? Keep out of the reach of children. Store at room temperature between 15 and 30 degrees C (59 to 86 degrees F). Protect from light. Keep container tightly closed. Throw away any unused medicine after the expiration date. NOTE: This sheet is a summary. It may not cover all possible information. If you have questions about this medicine, talk to your doctor, pharmacist, or health care provider.  2020 Elsevier/Gold Standard (2017-12-12 11:46:46)  

## 2019-10-28 NOTE — Progress Notes (Signed)
SLEEP MEDICINE CLINIC    Provider:  Larey Seat, MD  Primary Care Physician:  London Pepper, MD [place] [address] [address]     Referring Provider: Dr. Percival Spanish, MD         Chief Complaint according to patient   Patient presents with:     New Patient (Initial Visit)     pt describes not sleeping well anymore. she has been having heart palpitations. she has been doing better about falling asleep, she just can't stay asleep. averages about 5-6 hours of sleep. has never had a SS.       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   This is a possible explanation for spontaneous arousals especially those that are preceded by other vivid dreams / abnormal dreams.  We see also more REM sleep related arousals in patients with anxiety or depression.   Some medications will help to suppress the proportion of REM sleep however that is not necessarily meaning that the patient sleeps better or more restorative.  Her medications as listed in her initial consultation have slightly changed she is taking propanolol, Flonase as needed she is also on 0.05 mg estradiol patch the so-called to be well developed.  In addition this progesterone 50 mg.  She is currently weaning off Estrogen /Progesterone and is down to 1/4 of her previous dose- has no menopausal symptoms at this point, feels well. She is finally acknowledging same anxiety to be present.       08-12-2019. Originally sent  from Dr Percival Spanish  for a sleep work up.   Chief concern  according to patient : " I have palpitations all day long. When I get enough sleep the palpitations are less noticeable. I take beta blockers and melatonin to help fall asleep".  "I now have to avoid all caffeine and ETOH". I have the pleasure of seeing Cynthia Nguyen today, a right -handed Other or two or more races female with a possible sleep disorder. She  has a past medical history of Fibrocystic breast disease, Generalized anxiety disorder, Mitral valve prolapse, Scoliosis, and Seasonal allergies.  she has scoliosis, no surgical treatment.  Sleep relevant medical history: palpitations, insomnia. Averaging 5.5 hours of sleep per fit bit. No Tonsillectomy but large tonsils- snoring . A deviated septum was found by Dr Wilburn Cornelia.    Family medical /sleep history:  Mother with insomnia, and RLS.  her insomnia started after her husband passed away.  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 Living with mother and partner. The patient currently works in her own office. Pets are present, 2 small dogs. . Tobacco use- none.  ETOH use - rarely , Caffeine intake is much reduced - Regular exercise in form of swimming- helps with scoliosis. Some walking, some weight lifting.     Sleep habits are as follows:  The patient's dinner time is between 5-6  PM. The patient goes  to bed at 10.30 PM and asleep by 11 PM- she continues to sleep for 5-6 hours, wakes the first time at 3-4 AM.   The preferred sleep position is prone , with the support of no pillows.  Dreams are reportedly infrequent. 6.30  AM is the usual rise time. The patient wakes up spontaneously at 4- often that's the end of sleep. Often with palpitations.  She reports not feeling refreshed or restored in AM, with symptoms such as dry mouth, nasal congestion,  headaches in daytime-and residual fatigue.  Naps are taken frequently, but she is doing  less of it- lasting from 2-3 hours minutes and are more refreshing than nocturnal sleep. They make sleep the following night almost impossibel.    Review of Systems: Out of a complete 14 system review, the patient complains of only the following symptoms, and all other reviewed systems are negative.:  Fatigue, sleepiness , snoring, palpitations. Low BP-  Lives with same sex partner.  fragmented sleep, Insomnia- early arousals. Scoliosis and back pain-    How likely are you to doze in the following situations: 0 = not likely, 1 = slight chance, 2 = moderate chance, 3 = high chance   Sitting and Reading? Watching Television? Sitting inactive in a public place (theater or meeting)? As a passenger in a car for an hour without a break? Lying down in the afternoon when circumstances permit? Sitting and talking to someone? Sitting quietly after lunch without alcohol? In a car, while stopped for a few minutes in traffic?   Total = 11/ 24 points   FSS endorsed at 40/ 63 points.   struggles with anxiety but not depression.   Social History   Socioeconomic History   Marital status: Single    Spouse name: Not on file   Number of children: Not on file   Years of education: Not on file   Highest education level: Not on file  Occupational History   Occupation: Dentist    Employer: [employer]  Tobacco Use   Smoking status: Never Smoker   Smokeless tobacco: Never Used  Substance and Sexual Activity   Alcohol use: Yes    Alcohol/week: 2.0 standard drinks    Types: 2 Standard drinks or equivalent per week    Comment: couple per week.    Drug use: No   Sexual activity: Not on file  Other Topics Concern   Not on file  Social History Narrative   Not on file   Social Determinants of Health   Financial Resource Strain:    Difficulty of Paying Living Expenses: Not on file  Food Insecurity:    Worried About [place] in the Last Year: Not on file   Ran Out of  Food in the Last Year: Not on file  Transportation Needs:    Lack of Transportation (Medical): Not on file   Lack of Transportation (Non-Medical): Not on file  Physical Activity:    Days of Exercise per Week: Not on file   Minutes of Exercise per Session: Not on file  Stress:    Feeling of Stress : Not on file  Social Connections:    Frequency of Communication with Friends and Family: Not on file   Frequency of Social Gatherings with Friends and Family: Not on file   Attends Religious Services: Not on file   Active Member of Clubs or Organizations: Not on file   Attends Archivist Meetings: Not on file   Marital  Status: Not on file    Family History  Problem Relation Age of Onset   Atrial fibrillation Father    Diabetes Father    Colon cancer Father    Colon polyps Mother    Hypertension Mother    Emphysema Paternal Uncle    Stomach cancer Neg Hx    Rectal cancer Neg Hx     Past Medical History:  Diagnosis Date   Fibrocystic breast disease    Generalized anxiety disorder    Mitral valve prolapse    childhood   Scoliosis    per pt >60% curvature   Seasonal allergies     Past Surgical History:  Procedure Laterality Date   BREAST CYST ASPIRATION  08/16/2010   BREAST CYST ASPIRATION  12/25/2009   BREAST CYST ASPIRATION  07/22/2009   WISDOM TOOTH EXTRACTION       Current Outpatient Medications on File Prior to Visit  Medication Sig Dispense Refill   calcium carbonate (CALCIUM 600) 600 MG TABS tablet Take 600 mg by mouth 2 (two) times daily with a meal.      Cholecalciferol (VITAMIN D3) 50 MCG (2000 UT) TABS Take 1 tablet by mouth daily.     estradiol (VIVELLE-DOT) 0.05 MG/24HR patch Place 1 patch onto the skin as directed. Using .25 of patch twice weekly.     fluticasone (FLONASE) 50 MCG/ACT nasal spray USE 2 SPRAY(S) IN EACH NOSTRIL ONCE DAILY     PROGESTERONE PO Take 50 mg by mouth as directed. She is taking one tablet  daily for two weeks on then two week off.     propranolol (INDERAL) 10 MG tablet Take 1 tablet (10 mg total) by mouth as needed. 30 tablet 6   propranolol ER (INDERAL LA) 80 MG 24 hr capsule TAKE ONE CAPSULE BY MOUTH DAILY AROUND DINNERT IME 30 capsule 10   No current facility-administered medications on file prior to visit.    Allergies  Allergen Reactions   Cephalexin     dizziness    Physical exam:  Today's Vitals   10/28/19 1427  BP: 100/69  Pulse: (!) 59  Weight: 143 lb 8 oz (65.1 kg)  Height: 5\' 4"  (1.626 m)   Body mass index is 24.63 kg/m.   Wt Readings from Last 3 Encounters:  10/28/19 143 lb 8 oz (65.1 kg)  08/12/19 141 lb (64 kg)  11/23/18 142 lb (64.4 kg)     Ht Readings from Last 3 Encounters:  10/28/19 5\' 4"  (1.626 m)  08/12/19 5\' 4"  (1.626 m)  11/23/18 5' 3.5" (1.613 m)      General: The patient is awake, alert and appears not in acute distress. The patient is well groomed. Head: Normocephalic, atraumatic. Neck is supple. Mallampati 2- left tonsil is much more enlarged.   neck circumference:14 inches . Nasal airflow  patent.  Retrognathia is not seen.  Dental status: intact  Cardiovascular:  Regular rate and cardiac rhythm by pulse,  without distended neck veins. Respiratory: Lungs are clear to auscultation.  Skin:  Without evidence of ankle edema, or rash. Trunk: The patient's posture is erect.   Neurologic exam : The patient is awake and alert, oriented to place and time.   Memory subjective described as intact.  Attention span & concentration ability appears normal.  Speech is fluent,   without  dysarthria, dysphonia or aphasia.  Mood and affect are appropriate.   Cranial nerves: no loss of smell or taste reported  Pupils are equal and briskly reactive  to light. Funduscopic exam without palor or scars, no edema.  Extraocular movements in vertical and horizontal planes were intact and without nystagmus. No Diplopia. Facial motor strength is  symmetric and tongue and uvula move midline.  Neck ROM : rotation, tilt and flexion extension were normal for age and shoulder shrug was symmetrical.    Motor exam:  Symmetric bulk, tone and ROM.   Normal tone without cog -wheeling, symmetric grip strength .  Deep tendon reflexes: in the  upper and lower extremities are symmetric and intact.  Babinski response was up going- she is very ticklish.        After spending a total time of 20 minutes face to face and additional time for physical and neurologic examination, review of laboratory studies,  personal review of imaging studies, reports and results of other testing and review of referral information / records as far as provided in visit, I have established the following assessments:  1)  Insomnia has further improved - with the help of progesterone- but she wants to reduce her HRT- , and by having quit long naps, caffeine and alcohol. Takes 3 mg melatonin now.  2)  Nasal septal deviation and tonsillary enlargement- uses nasacort .  3)  Waking less often gasping - rarely reports shortness of breath.  She has reduced stressors, increased sleep.     My Plan is to proceed with:  1) trazodone offered- I think she will benefit from sounder sleep and the anxiety reduction of this drug.  She will start on 1/2 tab , 25 mg at bedtime and , if tolerated, will increase to 50 mg.  Plan B would be Zoloft.     I would like to thank Dr Percival Spanish and London Pepper, [place] [address] [place],  [place] [address] for allowing me to meet with and to take care of this pleasant patient.   I plan to follow up either personally or through our NP within 3-4 month.     Electronically signed by: Larey Seat, MD 10/28/2019 2:56 PM  [facility] Board certified by The [employer] of Sleep Medicine and Diplomate of the [employer] of Sleep Medicine. Board certified In Neurology through the [place],  Fellow of the [employer] of Neurology. Medical Director of [facility].

## 2019-11-04 ENCOUNTER — Ambulatory Visit: Payer: 59 | Admitting: Cardiology

## 2019-11-17 NOTE — Progress Notes (Signed)
Cardiology Office Note   Date:  11/18/2019   ID:  Catera Hankins, DOB 04-26-1970, MRN [id number]  PCP:  London Pepper, MD  Cardiologist:   Minus Breeding, MD Referring:  Self   Chief Complaint  Patient presents with  . Palpitations      History of Present Illness: Janne Faulk is a 49 y.o. female who presents for evaluation of palpitations.     She had tachycardia at age 34 treated with beta blockers.  She came off of these in her mid 63s.  She was having more palpitations in 2015 and had a Holter with no significant arrhythmias.  She had a negative stress test in 2017.  Coronary calcium was zero.  Echo was normal.   Since I last saw her she says she is back to about 75% in terms of sleeping better and having fewer palpitations.  She rarely has to take the extra 10 mg propranolol but she does sometimes at night if her heart is skipping and she is having trouble going to sleep.  Past Medical History:  Diagnosis Date  . Fibrocystic breast disease   . Generalized anxiety disorder   . Mitral valve prolapse    childhood  . Scoliosis    per pt >60% curvature  . Seasonal allergies     Past Surgical History:  Procedure Laterality Date  . BREAST CYST ASPIRATION  08/16/2010  . BREAST CYST ASPIRATION  12/25/2009  . BREAST CYST ASPIRATION  07/22/2009  . WISDOM TOOTH EXTRACTION       Current Outpatient Medications  Medication Sig Dispense Refill  . calcium carbonate (CALCIUM 600) 600 MG TABS tablet Take 600 mg by mouth 2 (two) times daily with a meal.     . Cholecalciferol (VITAMIN D3) 50 MCG (2000 UT) TABS Take 1 tablet by mouth daily.    . fluticasone (FLONASE) 50 MCG/ACT nasal spray USE 2 SPRAY(S) IN EACH NOSTRIL ONCE DAILY    . propranolol (INDERAL) 10 MG tablet Take 1 tablet (10 mg total) by mouth as needed. 30 tablet 6  . propranolol ER (INDERAL LA) 80 MG 24 hr capsule TAKE ONE CAPSULE BY MOUTH DAILY AROUND DINNERT IME 30 capsule 10  . traZODone (DESYREL) 50 MG tablet  Start on 25 mg ( 0.5 tablet) for 8 days and advance to full tab if tolerated. 30 tablet 5   No current facility-administered medications for this visit.    Allergies:   Cephalexin    ROS:  Please see the history of present illness.   Otherwise, review of systems are positive for none.   All other systems are reviewed and negative.    PHYSICAL EXAM: VS:  BP 100/82   Pulse 62   Ht 5\' 4"  (1.626 m)   Wt 143 lb 6.4 oz (65 kg)   SpO2 99%   BMI 24.61 kg/m  , BMI Body mass index is 24.61 kg/m. GENERAL:  Well appearing NECK:  No jugular venous distention, waveform within normal limits, carotid upstroke brisk and symmetric, no bruits, no thyromegaly LUNGS:  Clear to auscultation bilaterally CHEST:  Unremarkable HEART:  PMI not displaced or sustained,S1 and S2 within normal limits, no S3, no S4, no clicks, no rubs, no murmurs ABD:  Flat, positive bowel sounds normal in frequency in pitch, no bruits, no rebound, no guarding, no midline pulsatile mass, no hepatomegaly, no splenomegaly EXT:  2 plus pulses throughout, no edema, no cyanosis no clubbing    EKG:  EKG is  ordered  today. Normal sinus rhythm, rate 62, axis within normal limits, low voltage in the limb and chest leads, poor anterior R wave progression, no change from previous.  Recent Labs: No results found for requested labs within last 8760 hours.    Lipid Panel No results found for: CHOL, TRIG, HDL, CHOLHDL, VLDL, LDLCALC, LDLDIRECT    Wt Readings from Last 3 Encounters:  11/18/19 143 lb 6.4 oz (65 kg)  10/28/19 143 lb 8 oz (65.1 kg)  08/12/19 141 lb (64 kg)      Other studies Reviewed: Additional studies/ records that were reviewed today include:  Neurology Sleep notes Review of the above records demonstrates:  Please see elsewhere in the note.     ASSESSMENT AND PLAN:  PALPITATIONS:     She is doing much better on the regimen listed.  No change in therapy.   ABNORMAL EKG:     She has had an extensive work-up.   No change in therapy.  There is no structural heart disease.   SLEEP DISORDER: We talked about adding exercise to her regimen to even bring her to a higher level of overall wellness.   Current medicines are reviewed at length with the patient today.  The patient does not have concerns regarding medicines.  The following changes have been made:  None  Labs/ tests ordered today include:  None  Orders Placed This Encounter  Procedures  . EKG 12-Lead     Disposition:   Follow up in one year.    Signed, Minus Breeding, MD  11/18/2019 9:17 AM    [place]

## 2019-11-18 ENCOUNTER — Other Ambulatory Visit: Payer: Self-pay

## 2019-11-18 ENCOUNTER — Ambulatory Visit: Payer: BC Managed Care – PPO | Admitting: Cardiology

## 2019-11-18 ENCOUNTER — Encounter: Payer: Self-pay | Admitting: Cardiology

## 2019-11-18 VITALS — BP 100/82 | HR 62 | Ht 64.0 in | Wt 143.4 lb

## 2019-11-18 DIAGNOSIS — R002 Palpitations: Secondary | ICD-10-CM | POA: Diagnosis not present

## 2019-11-18 NOTE — Patient Instructions (Signed)

## 2019-12-23 ENCOUNTER — Other Ambulatory Visit: Payer: Self-pay | Admitting: Cardiology

## 2019-12-30 DIAGNOSIS — Z0000 Encounter for general adult medical examination without abnormal findings: Secondary | ICD-10-CM | POA: Diagnosis not present

## 2019-12-30 DIAGNOSIS — Z13220 Encounter for screening for lipoid disorders: Secondary | ICD-10-CM | POA: Diagnosis not present

## 2019-12-30 DIAGNOSIS — R7309 Other abnormal glucose: Secondary | ICD-10-CM | POA: Diagnosis not present

## 2019-12-30 DIAGNOSIS — Z113 Encounter for screening for infections with a predominantly sexual mode of transmission: Secondary | ICD-10-CM | POA: Diagnosis not present

## 2019-12-30 DIAGNOSIS — E559 Vitamin D deficiency, unspecified: Secondary | ICD-10-CM | POA: Diagnosis not present

## 2020-01-20 ENCOUNTER — Ambulatory Visit: Payer: BC Managed Care – PPO | Admitting: Neurology

## 2020-03-16 ENCOUNTER — Encounter: Payer: Self-pay | Admitting: Neurology

## 2020-03-16 ENCOUNTER — Ambulatory Visit: Payer: Managed Care, Other (non HMO) | Admitting: Neurology

## 2020-03-16 VITALS — BP 103/69 | HR 69 | Ht 64.0 in | Wt 144.0 lb

## 2020-03-16 DIAGNOSIS — R002 Palpitations: Secondary | ICD-10-CM

## 2020-03-16 DIAGNOSIS — G478 Other sleep disorders: Secondary | ICD-10-CM | POA: Diagnosis not present

## 2020-03-16 MED ORDER — TRAZODONE HCL 50 MG PO TABS: ORAL_TABLET | ORAL | 5 refills | Status: DC

## 2020-03-16 NOTE — Patient Instructions (Signed)
Quality Sleep Information, Adult Quality sleep is important for your mental and physical health. It also improves your quality of life. Quality sleep means you:  Are asleep for most of the time you are in bed.  Fall asleep within 30 minutes.  Wake up no more than once a night.  Are awake for no longer than 20 minutes if you do wake up during the night. Most adults need 7-8 hours of quality sleep each night. How can poor sleep affect me? If you do not get enough quality sleep, you may have:  Mood swings.  Daytime sleepiness.  Confusion.  Decreased reaction time.  Sleep disorders, such as insomnia and sleep apnea.  Difficulty with: ? Solving problems. ? Coping with stress. ? Paying attention. These issues may affect your performance and productivity at work, school, and at home. Lack of sleep may also put you at higher risk for accidents, suicide, and risky behaviors. If you do not get quality sleep you may also be at higher risk for several health problems, including:  Infections.  Type 2 diabetes.  Heart disease.  High blood pressure.  Obesity.  Worsening of long-term conditions, like arthritis, kidney disease, depression, Parkinson's disease, and epilepsy. What actions can I take to get more quality sleep?  Stick to a sleep schedule. Go to sleep and wake up at about the same time each day. Do not try to sleep less on weekdays and make up for lost sleep on weekends. This does not work.  Try to get about 30 minutes of exercise on most days. Do not exercise 2-3 hours before going to bed.  Limit naps during the day to 30 minutes or less.  Do not use any products that contain nicotine or tobacco, such as cigarettes or e-cigarettes. If you need help quitting, ask your health care provider.  Do not drink caffeinated beverages for at least 8 hours before going to bed. Coffee, tea, and some sodas contain caffeine.  Do not drink alcohol close to bedtime.  Do not eat  large meals close to bedtime.  Do not take naps in the late afternoon.  Try to get at least 30 minutes of sunlight every day. Morning sunlight is best.  Make time to relax before bed. Reading, listening to music, or taking a hot bath promotes quality sleep.  Make your bedroom a place that promotes quality sleep. Keep your bedroom dark, quiet, and at a comfortable room temperature. Make sure your bed is comfortable. Take out sleep distractions like TV, a computer, smartphone, and bright lights.  If you are lying awake in bed for longer than 20 minutes, get up and do a relaxing activity until you feel sleepy.  Work with your health care provider to treat medical conditions that may affect sleeping, such as: ? Nasal obstruction. ? Snoring. ? Sleep apnea and other sleep disorders.  Talk to your health care provider if you think any of your prescription medicines may cause you to have difficulty falling or staying asleep.  If you have sleep problems, talk with a sleep consultant. If you think you have a sleep disorder, talk with your health care provider about getting evaluated by a specialist.      Where to find more information  National Sleep Foundation website: https://sleepfoundation.org  National Heart, Lung, and Blood Institute (NHLBI): www.nhlbi.nih.gov/files/docs/public/sleep/healthy_sleep.pdf  Centers for Disease Control and Prevention (CDC): www.cdc.gov/sleep/index.html Contact a health care provider if you:  Have trouble getting to sleep or staying asleep.  Often wake   up very early in the morning and cannot get back to sleep.  Have daytime sleepiness.  Have daytime sleep attacks of suddenly falling asleep and sudden muscle weakness (narcolepsy).  Have a tingling sensation in your legs with a strong urge to move your legs (restless legs syndrome).  Stop breathing briefly during sleep (sleep apnea).  Think you have a sleep disorder or are taking a medicine that is  affecting your quality of sleep. Summary  Most adults need 7-8 hours of quality sleep each night.  Getting enough quality sleep is an important part of health and well-being.  Make your bedroom a place that promotes quality sleep and avoid things that may cause you to have poor sleep, such as alcohol, caffeine, smoking, and large meals.  Talk to your health care provider if you have trouble falling asleep or staying asleep. This information is not intended to replace advice given to you by your health care provider. Make sure you discuss any questions you have with your health care provider. Document Revised: 03/29/2017 Document Reviewed: 03/29/2017 Elsevier Patient Education  2021 [place]. Insomnia Insomnia is a sleep disorder that makes it difficult to fall asleep or stay asleep. Insomnia can cause fatigue, low energy, difficulty concentrating, mood swings, and poor performance at work or school. There are three different ways to classify insomnia:  Difficulty falling asleep.  Difficulty staying asleep.  Waking up too early in the morning. Any type of insomnia can be long-term (chronic) or short-term (acute). Both are common. Short-term insomnia usually lasts for three months or less. Chronic insomnia occurs at least three times a week for longer than three months. What are the causes? Insomnia may be caused by another condition, situation, or substance, such as:  Anxiety.  Certain medicines.  Gastroesophageal reflux disease (GERD) or other gastrointestinal conditions.  Asthma or other breathing conditions.  Restless legs syndrome, sleep apnea, or other sleep disorders.  Chronic pain.  Menopause.  Stroke.  Abuse of alcohol, tobacco, or illegal drugs.  Mental health conditions, such as depression.  Caffeine.  Neurological disorders, such as Alzheimer's disease.  An overactive thyroid (hyperthyroidism). Sometimes, the cause of insomnia may not be known. What  increases the risk? Risk factors for insomnia include:  Gender. Women are affected more often than men.  Age. Insomnia is more common as you get older.  Stress.  Lack of exercise.  Irregular work schedule or working night shifts.  Traveling between different time zones.  Certain medical and mental health conditions. What are the signs or symptoms? If you have insomnia, the main symptom is having trouble falling asleep or having trouble staying asleep. This may lead to other symptoms, such as:  Feeling fatigued or having low energy.  Feeling nervous about going to sleep.  Not feeling rested in the morning.  Having trouble concentrating.  Feeling irritable, anxious, or depressed. How is this diagnosed? This condition may be diagnosed based on:  Your symptoms and medical history. Your health care provider may ask about: ? Your sleep habits. ? Any medical conditions you have. ? Your mental health.  A physical exam. How is this treated? Treatment for insomnia depends on the cause. Treatment may focus on treating an underlying condition that is causing insomnia. Treatment may also include:  Medicines to help you sleep.  Counseling or therapy.  Lifestyle adjustments to help you sleep better. Follow these instructions at home: Eating and drinking  Limit or avoid alcohol, caffeinated beverages, and cigarettes, especially close to bedtime.  These can disrupt your sleep.  Do not eat a large meal or eat spicy foods right before bedtime. This can lead to digestive discomfort that can make it hard for you to sleep.   Sleep habits  Keep a sleep diary to help you and your health care provider figure out what could be causing your insomnia. Write down: ? When you sleep. ? When you wake up during the night. ? How well you sleep. ? How rested you feel the next day. ? Any side effects of medicines you are taking. ? What you eat and drink.  Make your bedroom a dark, comfortable  place where it is easy to fall asleep. ? Put up shades or blackout curtains to block light from outside. ? Use a white noise machine to block noise. ? Keep the temperature cool.  Limit screen use before bedtime. This includes: ? Watching TV. ? Using your smartphone, tablet, or computer.  Stick to a routine that includes going to bed and waking up at the same times every day and night. This can help you fall asleep faster. Consider making a quiet activity, such as reading, part of your nighttime routine.  Try to avoid taking naps during the day so that you sleep better at night.  Get out of bed if you are still awake after 15 minutes of trying to sleep. Keep the lights down, but try reading or doing a quiet activity. When you feel sleepy, go back to bed.   General instructions  Take over-the-counter and prescription medicines only as told by your health care provider.  Exercise regularly, as told by your health care provider. Avoid exercise starting several hours before bedtime.  Use relaxation techniques to manage stress. Ask your health care provider to suggest some techniques that may work well for you. These may include: ? Breathing exercises. ? Routines to release muscle tension. ? Visualizing peaceful scenes.  Make sure that you drive carefully. Avoid driving if you feel very sleepy.  Keep all follow-up visits as told by your health care provider. This is important. Contact a health care provider if:  You are tired throughout the day.  You have trouble in your daily routine due to sleepiness.  You continue to have sleep problems, or your sleep problems get worse. Get help right away if:  You have serious thoughts about hurting yourself or someone else. If you ever feel like you may hurt yourself or others, or have thoughts about taking your own life, get help right away. You can go to your nearest emergency department or call:  Your local emergency services (911 in the  [place]  A suicide crisis helpline, such as the National Suicide Prevention Lifeline at 1-800-273-8255. This is open 24 hours a day. Summary  Insomnia is a sleep disorder that makes it difficult to fall asleep or stay asleep.  Insomnia can be long-term (chronic) or short-term (acute).  Treatment for insomnia depends on the cause. Treatment may focus on treating an underlying condition that is causing insomnia.  Keep a sleep diary to help you and your health care provider figure out what could be causing your insomnia. This information is not intended to replace advice given to you by your health care provider. Make sure you discuss any questions you have with your health care provider. Document Revised: 10/31/2019 Document Reviewed: 10/31/2019 Elsevier Patient Education  2021 Elsevier Inc.  

## 2020-03-16 NOTE — Progress Notes (Signed)
SLEEP MEDICINE CLINIC    Provider:  Larey Seat, MD  Primary Care Physician:  London Pepper, MD [place] [address] [address]     Referring Provider: Dr. Percival Spanish, MD         Chief Complaint according to patient   Patient presents with:    . 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 She sleeps 6 hours at night and will take a power nap.  Epworth score at 12/ 24  points. FSS at 13 points. Refill Trazodone.        10-28-2019 Dr. 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   This is a possible explanation for spontaneous arousals especially those that are preceded by other vivid dreams / abnormal dreams.  We see also more REM sleep related arousals in patients with anxiety or depression.   Some medications will help to suppress the proportion of REM sleep however that is not necessarily meaning that the patient sleeps better or more restorative.  Her medications as listed in her initial consultation have slightly changed she is taking propanolol, Flonase as needed she is also on 0.05 mg estradiol patch the so-called to be well developed.  In addition this progesterone 50 mg.  She is currently weaning off Estrogen /Progesterone and is down to 1/4 of her previous dose- has no menopausal symptoms at this point, feels well. She is finally acknowledging same anxiety  to be present.       08-12-2019. Originally sent  from Dr Percival Spanish  for a sleep work up.   Chief concern according to patient : " I have palpitations all day long. When I get enough sleep the palpitations are less noticeable. I take beta blockers and melatonin to help fall asleep".  "I now have to avoid all caffeine and ETOH". I have the pleasure of seeing Cynthia Nguyen today, a right -handed Other or two or more races female with a possible sleep disorder. She  has a past medical history of Fibrocystic breast disease, Generalized anxiety disorder, Mitral valve prolapse, Scoliosis, and Seasonal allergies.  she has scoliosis, no surgical treatment.  Sleep relevant medical history: palpitations, insomnia. Averaging 5.5 hours of sleep per fit bit. No Tonsillectomy but large tonsils- snoring . A deviated septum was found by Dr Wilburn Cornelia.    Family medical /sleep history:  Mother with insomnia, and RLS.  her insomnia started after her husband passed away.  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 Living with mother and partner. The patient currently works in her own office. Pets are present, 2 small dogs. . Tobacco use- none.  ETOH use - rarely , Caffeine intake is much reduced - Regular exercise in form of swimming- helps with scoliosis.  Some walking, some weight lifting.     Sleep habits are as follows:  The patient's dinner time is between 5-6  PM. The patient goes to bed at 10.30 PM and asleep by 11 PM- she continues to sleep for 5-6 hours, wakes the first time at 3-4 AM.   The preferred sleep position is prone , with the support of no pillows.  Dreams are reportedly infrequent. 6.30  AM is the usual rise time. The patient wakes up spontaneously at 4- often that's the end of sleep. Often with palpitations.  She reports not feeling refreshed or restored in AM, with symptoms such as dry mouth,  nasal congestion,  headaches in daytime-and residual fatigue.  Naps are taken frequently, but she is doing less of it- lasting from 2-3 hours minutes and are more refreshing than nocturnal sleep. They make sleep the following night almost impossibel.    Review of Systems: Out of a complete 14 system review, the patient complains of only the following symptoms, and all other reviewed systems are negative.:  Fatigue, sleepiness , snoring, palpitations. Low BP-  Lives with same sex partner.  fragmented sleep, Insomnia- early arousals. Scoliosis and back pain-    How likely are you to doze in the following situations: 0 = not likely, 1 = slight chance, 2 = moderate chance, 3 = high chance   Sitting and Reading? Watching Television? Sitting inactive in a public place (theater or meeting)? As a passenger in a car for an hour without a break? Lying down in the afternoon when circumstances permit? Sitting and talking to someone? Sitting quietly after lunch without alcohol? In a car, while stopped for a few minutes in traffic?   Total = 11/ 24 points   FSS endorsed at 40/ 63 points.   struggles with anxiety but not depression.   Social History   Socioeconomic History  . Marital status: Single    Spouse name: Not on file  . Number of children: Not on file  . Years of education: Not on file  . Highest education level: Not on file  Occupational History  . 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 Smoking status: Never Smoker  . Smokeless tobacco: Never Used  Substance and Sexual Activity  . Alcohol use: Yes    Alcohol/week: 2.0 standard drinks    Types: 2 Standard drinks or equivalent per week    Comment: couple per week.   . Drug use: No  . Sexual activity: Not on file  Other Topics Concern  . Not on file  Social History Narrative  . Not on file   Social Determinants of Health   Financial Resource Strain: Not on file  Food Insecurity: Not on file   Transportation Needs: Not on file  Physical Activity: Not on file  Stress: Not on file  Social Connections: Not on file    Family History  Problem Relation Age of Onset  . Atrial fibrillation Father   . Diabetes Father   . Colon cancer Father   . Colon polyps Mother   . Hypertension Mother   . Emphysema Paternal Uncle   . Stomach cancer Neg Hx   . Rectal cancer Neg Hx     Past Medical History:  Diagnosis Date  . Fibrocystic breast disease   . Generalized anxiety disorder   . Mitral valve prolapse    childhood  . Scoliosis    per pt >60% curvature  . Seasonal allergies  Past Surgical History:  Procedure Laterality Date  . BREAST CYST ASPIRATION  08/16/2010  . BREAST CYST ASPIRATION  12/25/2009  . BREAST CYST ASPIRATION  07/22/2009  . WISDOM TOOTH EXTRACTION       Current Outpatient Medications on File Prior to Visit  Medication Sig Dispense Refill  . amoxicillin (AMOXIL) 500 MG capsule Take 500 mg by mouth 2 (two) times daily.    . calcium carbonate (OS-CAL) 600 MG TABS tablet Take 600 mg by mouth 2 (two) times daily with a meal.     . Cholecalciferol (VITAMIN D3) 50 MCG (2000 UT) TABS Take 1 tablet by mouth daily.    Marland Kitchen estradiol (VIVELLE-DOT) 0.1 MG/24HR patch Place 1 patch onto the skin 2 (two) times a week.    . fluticasone (FLONASE) 50 MCG/ACT nasal spray USE 2 SPRAY(S) IN EACH NOSTRIL ONCE DAILY    . progesterone (PROMETRIUM) 100 MG capsule Take 1 capsule by mouth daily.    . propranolol (INDERAL) 10 MG tablet Take 1 tablet (10 mg total) by mouth as needed. 30 tablet 6  . propranolol ER (INDERAL LA) 80 MG 24 hr capsule TAKE ONE CAPSULE BY MOUTH DAILY AROUND DINNERT IME 30 capsule 10  . traZODone (DESYREL) 50 MG tablet Start on 25 mg ( 0.5 tablet) for 8 days and advance to full tab if tolerated. 30 tablet 5   No current facility-administered medications on file prior to visit.    Allergies  Allergen Reactions  . Cephalexin     dizziness    Physical  exam:  Today's Vitals   03/16/20 0825  BP: 103/69  Pulse: 69  Weight: 144 lb (65.3 kg)  Height: 5\' 4"  (1.626 m)   Body mass index is 24.72 kg/m.   Wt Readings from Last 3 Encounters:  03/16/20 144 lb (65.3 kg)  11/18/19 143 lb 6.4 oz (65 kg)  10/28/19 143 lb 8 oz (65.1 kg)     Ht Readings from Last 3 Encounters:  03/16/20 5\' 4"  (1.626 m)  11/18/19 5\' 4"  (1.626 m)  10/28/19 5\' 4"  (1.626 m)      General: The patient is awake, alert and appears not in acute distress. The patient is well groomed. Head: Normocephalic, atraumatic. Neck is supple. Mallampati 2- left tonsil is much more enlarged.   neck circumference:14 inches . Nasal airflow  patent.  Retrognathia is not seen.  Dental status: intact  Cardiovascular:  Regular rate and cardiac rhythm by pulse,  without distended neck veins. Respiratory: Lungs are clear to auscultation.  Skin:  Without evidence of ankle edema, or rash. Trunk: The patient's posture is erect.   Neurologic exam : The patient is awake and alert, oriented to place and time.   Memory subjective described as intact.  Attention span & concentration ability appears normal.  Speech is fluent,   without  dysarthria, dysphonia or aphasia.  Mood and affect are appropriate.   Cranial nerves: no loss of smell or taste reported  Pupils are equal and briskly reactive to light. Funduscopic exam without palor or scars, no edema.  Extraocular movements in vertical and horizontal planes were intact and without nystagmus. No Diplopia. Facial motor strength is symmetric and tongue and uvula move midline.  Neck ROM : rotation, tilt and flexion extension were normal for age and shoulder shrug was symmetrical.    Motor exam:  Symmetric bulk, tone and ROM.   Normal tone without cog -wheeling, symmetric grip strength .  Deep tendon reflexes: in the  upper and lower extremities are symmetric and intact.  Babinski response was up going- she is very ticklish.         After spending a total time of 20 minutes face to face and additional time for physical and neurologic examination, review of laboratory studies,  personal review of imaging studies, reports and results of other testing and review of referral information / records as far as provided in visit, I have established the following assessments:  1)  Insomnia has further improved - with the help of progesterone- but she wants to reduce her HRT- , and by having quit long naps, caffeine and alcohol. Takes 3 mg melatonin now.  2)  Nasal septal deviation and tonsillary enlargement- uses nasacort .  3)  Waking less often gasping - rarely reports shortness of breath.  She has reduced stressors, increased sleep.     My Plan is to proceed with:  1) Refilled trazodone:  I think she will continue to benefit from sounder sleep and  anxiety reduction.  She will start on 1/2 tab , 25 mg at bedtime and , if tolerated, will increase to 50 mg. PRN use.  2) Plan B would be Zoloft.     I would like to thank Dr Percival Spanish and London Pepper, [place] [address] [place] [address],  [place] [id number] for allowing me to meet with and to take care of this pleasant patient.   I plan to follow up either personally or through our NP within 6-9 month.     Electronically signed by: Larey Seat, MD 03/16/2020 8:51 AM  [facility] Board certified by The [employer] of Sleep Medicine and Diplomate of the [employer] of Sleep Medicine. Board certified In Neurology through the [place], Fellow of the [employer] of Neurology. Medical Director of [facility].

## 2020-04-10 ENCOUNTER — Other Ambulatory Visit: Payer: Self-pay

## 2020-04-10 ENCOUNTER — Ambulatory Visit (INDEPENDENT_AMBULATORY_CARE_PROVIDER_SITE_OTHER): Payer: Managed Care, Other (non HMO) | Admitting: Otolaryngology

## 2020-04-10 VITALS — Temp 97.2°F

## 2020-04-10 DIAGNOSIS — J351 Hypertrophy of tonsils: Secondary | ICD-10-CM | POA: Diagnosis not present

## 2020-04-10 NOTE — Progress Notes (Signed)
HPI: Piccola Arico is a 50 y.o. female who presents is referred by her PCP, Dr. Orland Mustard for evaluation of tonsils.  Patient apparently developed a tonsil infection the first part of February after kissing a new boyfriend from [place]..  She apparently developed swollen tonsils with sore throat and white debris on the tonsils.  She also has some swollen glands in her neck.  She had a strep test that was negative.  She was treated with antibiotics.  She had the white debris and swollen tonsils for several weeks but after a month the tonsils were much better.  She was treated with a course of amoxicillin.  She is also using Flonase for allergies. She is having no sore throat today. She states that she has always had large tonsils. 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  Past Medical History:  Diagnosis Date  . Fibrocystic breast disease   . Generalized anxiety disorder   . Mitral valve prolapse    childhood  . Scoliosis    per pt >60% curvature  . Seasonal allergies    Past Surgical History:  Procedure Laterality Date  . BREAST CYST ASPIRATION  08/16/2010  . BREAST CYST ASPIRATION  12/25/2009  . BREAST CYST ASPIRATION  07/22/2009  . WISDOM TOOTH EXTRACTION     Social History   Socioeconomic History  . Marital status: Single    Spouse name: Not on file  . Number of children: Not on file  . Years of education: Not on file  . Highest education level: Not on file  Occupational History  . 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 Smoking status: Never Smoker  . Smokeless tobacco: Never Used  Substance and Sexual Activity  . Alcohol use: Yes    Alcohol/week: 2.0 standard drinks    Types: 2 Standard drinks or equivalent per week    Comment: couple per week.   . Drug use: No  . Sexual activity: Not on file  Other Topics Concern  . Not on file  Social History Narrative  . Not on file   Social Determinants  of Health   Financial Resource Strain: Not on file  Food Insecurity: Not on file  Transportation Needs: Not on file  Physical Activity: Not on file  Stress: Not on file  Social Connections: Not on file   Family History  Problem Relation Age of Onset  . Atrial fibrillation Father   . Diabetes Father   . Colon cancer Father   . Colon polyps Mother   . Hypertension Mother   . Emphysema Paternal Uncle   . Stomach cancer Neg Hx   . Rectal cancer Neg Hx    Allergies  Allergen Reactions  . Cephalexin     dizziness   Prior to Admission medications   Medication Sig Start Date End Date Taking? Authorizing Provider  amoxicillin (AMOXIL) 500 MG capsule Take 500 mg by mouth 2 (two) times daily. 03/10/20   [provider]  calcium carbonate (OS-CAL) 600 MG TABS tablet Take 600 mg by mouth 2 (two) times daily with a meal.     [provider]  Cholecalciferol (VITAMIN D3) 50 MCG (2000 UT) TABS Take 1 tablet by mouth daily.    [provider]  estradiol (VIVELLE-DOT) 0.1 MG/24HR patch Place 1 patch onto the skin 2 (two) times a week. 03/10/20   [provider]  fluticasone (FLONASE) 50  MCG/ACT nasal spray USE 2 SPRAY(S) IN EACH NOSTRIL ONCE DAILY 05/08/18   [provider]  progesterone (PROMETRIUM) 100 MG capsule Take 1 capsule by mouth daily.    [provider]  propranolol (INDERAL) 10 MG tablet Take 1 tablet (10 mg total) by mouth as needed. 07/22/19   Minus Breeding, MD  propranolol ER (INDERAL LA) 80 MG 24 hr capsule TAKE ONE CAPSULE BY MOUTH DAILY AROUND DINNERT IME 12/24/19   Minus Breeding, MD  traZODone (DESYREL) 50 MG tablet Start on 25 mg ( 0.5 tablet) for 8 days and advance to full tab if tolerated. 03/16/20   Dohmeier, Asencion Partridge, MD     Positive ROS: Otherwise negative  All other systems have been reviewed and were otherwise negative with the exception of those mentioned in the HPI and as above.  Physical Exam: Constitutional:  Alert, well-appearing, no acute distress Ears: External ears without lesions or tenderness. Ear canals are clear bilaterally with intact, clear TMs.  Nasal: External nose without lesions.. Clear nasal passages Oral: Lips and gums without lesions. Tongue and palate mucosa without lesions. Posterior oropharynx clear.  Patient has large bilateral 2-3+ tonsils left side slightly larger than the right but minimal difference.  No acute exudate noted but she has several tonsillar crypts with white debris.  Palpation of the tonsils was soft as was palpation of the base of tongue. Neck: No palpable adenopathy or masses.  On examination of the neck she has moderate size jugulodigastric node on the right side compared to the left but this is mobile and soft consistent more with reactive lymphadenopathy.  She has no other significant adenopathy lower in the jugular chain of nodes on the posterior cervical nodes. Respiratory: Breathing comfortably  Skin: No facial/neck lesions or rash noted.  Procedures  Assessment: Bilateral tonsillar hypertrophy.  No evidence of neoplasm. I suspect she had a viral type infection of her tonsils as this persisted for several weeks.  Plan: Reassured patient of normal appearance of tonsils presently with no evidence of neoplasm. If she is getting frequent tonsil infections 3-4 times a year could consider tonsillectomy but presently the tonsils are not giving her any problems. If she develops any future infections or tonsil problems she will follow-up as needed.   Radene Journey, MD   CC:

## 2020-07-21 ENCOUNTER — Other Ambulatory Visit: Payer: Self-pay | Admitting: Family Medicine

## 2020-07-21 DIAGNOSIS — Z1231 Encounter for screening mammogram for malignant neoplasm of breast: Secondary | ICD-10-CM

## 2020-08-07 MED ORDER — DILTIAZEM HCL 30 MG PO TABS: 30.0000 mg | ORAL_TABLET | Freq: Three times a day (TID) | ORAL | 3 refills | Status: DC | PRN

## 2020-08-27 NOTE — Telephone Encounter (Signed)
Tried to call pt, left message to call back for Hochrein's change in medications.  We could stop the PRN Cardizem short acting and try to add CD Cardizem(DILTIAZEM) 120 mg to the propranolol and see if long acting works.  If not we will likely try a different beta blocker. Per [facility]

## 2020-08-28 ENCOUNTER — Telehealth: Payer: Self-pay | Admitting: Cardiology

## 2020-08-28 MED ORDER — DILTIAZEM HCL ER COATED BEADS 120 MG PO CP24: 120.0000 mg | ORAL_CAPSULE | Freq: Every day | ORAL | 3 refills | Status: DC

## 2020-08-28 NOTE — Telephone Encounter (Signed)
Pt is returning a call stating she is reaching back out to doctor Cynthia Nguyen

## 2020-08-28 NOTE — Telephone Encounter (Signed)
Spoke with the pt and she verbalized understanding.     We could stop the PRN Cardizem short acting and try to add CD Cardizem(DILTIAZEM) 120 mg to the propranolol and see if long acting works.  If not we will likely try a different beta blocker. Per [facility]

## 2020-09-04 ENCOUNTER — Other Ambulatory Visit: Payer: Self-pay | Admitting: *Deleted

## 2020-09-04 MED ORDER — PROPRANOLOL HCL 10 MG PO TABS: 10.0000 mg | ORAL_TABLET | ORAL | 6 refills | Status: DC | PRN

## 2020-09-04 MED ORDER — ATENOLOL 25 MG PO TABS: 25.0000 mg | ORAL_TABLET | Freq: Two times a day (BID) | ORAL | 6 refills | Status: DC

## 2020-09-11 ENCOUNTER — Telehealth: Payer: Self-pay | Admitting: Cardiology

## 2020-09-11 NOTE — Telephone Encounter (Signed)
I would recommend decreasing atenolol to either '25mg'$  daily or 12.'5mg'$  BID.

## 2020-09-11 NOTE — Telephone Encounter (Signed)
Called patient advised of message below.  She will try this, and see how she does.  Patient verbalized understanding.

## 2020-09-11 NOTE — Telephone Encounter (Signed)
Called transferred into triage-  Patient states that she started on Monday with the Atenolol 25 mg twice daily. She is still taking the Cardizem that was requested she continue (although she states this does nothing for her) she states that she was on Propranolol but after review of previous message this was changed. She states that she felt okay on Monday-Wednesday but since then she has been very nauseated, dizzy and not feeling well. She wears an apple watch and it has notified her that she has a HR below 50. Patient states that she feels anxious and just does not feel great. She did check her BP this morning it was 90/54 (normally in the 100's) and HR was 45. When she checked her HR just now on the phone it was 53. She states that now she thinks she may be dropped too low. Patient has ate this morning, and is at work and is feeling okay- but wanted to bring this Dr.Hochrein's attention for any other adjustments.   Advised I would route message and would call back with any changes.  Patient verbalized understanding.

## 2020-09-11 NOTE — Telephone Encounter (Signed)
Pt c/o medication issue:  1. Name of Medication: Atenolol,  2. How are you currently taking this medication (dosage and times per day)? 2 times a day  3. Are you having a reaction (difficulty breathing--STAT)?   4. What is your medication issue? Dizziness, nauseated, not just feeling well, blood pressure and pulse lower than normal- pule was 45 a few minutes

## 2020-09-14 ENCOUNTER — Ambulatory Visit
Admission: RE | Admit: 2020-09-14 | Discharge: 2020-09-14 | Disposition: A | Discharge status: Home / Self Care | Payer: Managed Care, Other (non HMO) | Source: Ambulatory Visit | Attending: Family Medicine | Admitting: Family Medicine

## 2020-09-14 ENCOUNTER — Other Ambulatory Visit: Payer: Self-pay

## 2020-09-14 DIAGNOSIS — Z1231 Encounter for screening mammogram for malignant neoplasm of breast: Secondary | ICD-10-CM

## 2020-11-10 ENCOUNTER — Other Ambulatory Visit: Payer: Self-pay

## 2020-11-10 ENCOUNTER — Ambulatory Visit: Payer: Managed Care, Other (non HMO) | Admitting: Physician Assistant

## 2020-11-10 DIAGNOSIS — D485 Neoplasm of uncertain behavior of skin: Secondary | ICD-10-CM

## 2020-11-10 DIAGNOSIS — L658 Other specified nonscarring hair loss: Secondary | ICD-10-CM

## 2020-11-10 DIAGNOSIS — Z86018 Personal history of other benign neoplasm: Secondary | ICD-10-CM | POA: Diagnosis not present

## 2020-11-10 DIAGNOSIS — D2261 Melanocytic nevi of right upper limb, including shoulder: Secondary | ICD-10-CM | POA: Diagnosis not present

## 2020-11-10 DIAGNOSIS — Z1283 Encounter for screening for malignant neoplasm of skin: Secondary | ICD-10-CM | POA: Diagnosis not present

## 2020-11-10 NOTE — Patient Instructions (Addendum)
5% over the counter Rogaine daily treatment no cure.   Trudy at [employer]        Biopsy, Surgery (Curettage) & Surgery (Excision) Aftercare Instructions  1. Okay to remove bandage in 24 hours  2. Wash area with soap and water  3. Apply Vaseline to area twice daily until healed (Not Neosporin)  4. Okay to cover with a Band-Aid to decrease the chance of infection or prevent irritation from clothing; also it's okay to uncover lesion at home.  5. Suture instructions: return to our office in 7-10 or 10-14 days for a nurse visit for suture removal. Variable healing with sutures, if pain or itching occurs call our office. It's okay to shower or bathe 24 hours after sutures are given.  6. The following risks may occur after a biopsy, curettage or excision: bleeding, scarring, discoloration, recurrence, infection (redness, yellow drainage, pain or swelling).  7. For questions, concerns and results call our office at [place] before 4pm & Friday before 3pm. Biopsy results will be available in 1 week.

## 2020-11-13 ENCOUNTER — Encounter: Payer: Self-pay | Admitting: Physician Assistant

## 2020-11-13 NOTE — Progress Notes (Signed)
   Follow-Up Visit   Subjective  Cynthia Nguyen is a 50 y.o. female who presents for the following: Annual Exam (Here for full body skin exam. Concerns bumps on face. They are getting worse over time. They are dark lesions. History of atypical moles. ).   The following portions of the chart were reviewed this encounter and updated as appropriate:  Tobacco  Allergies  Meds  Problems  Med Hx  Surg Hx  Fam Hx      Objective  Well appearing patient in no apparent distress; mood and affect are within normal limits.  A full examination was performed including scalp, head, eyes, ears, nose, lips, neck, chest, axillae, abdomen, back, buttocks, bilateral upper extremities, bilateral lower extremities, hands, feet, fingers, toes, fingernails, and toenails. All findings within normal limits unless otherwise noted below.  Head to toe History of atypia.No signs of non-mole skin cancer.  Facial lentigines and seborrheic keratosis.   Scalp Diffuse thinning entire scalp. No signs of scalp disease.   Right Forearm - Anterior Bichromic dark nested macule.       Assessment & Plan  Screening exam for skin cancer Head to toe  Yearly skin examinations. Laser consult for her face!!  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   Neoplasm of uncertain behavior of skin Right Forearm - Anterior  Skin / nail biopsy Type of biopsy: tangential   Informed consent: discussed and consent obtained   Timeout: patient name, date of birth, surgical site, and procedure verified   Anesthesia: the lesion was anesthetized in a standard fashion   Anesthetic:  1% lidocaine w/ epinephrine 1-100,000 local infiltration Instrument used: flexible razor blade   Hemostasis achieved with: aluminum chloride and electrodesiccation   Outcome: patient tolerated procedure well   Post-procedure details: wound care instructions given    Specimen 1 - Surgical  pathology Differential Diagnosis: atypia   Check Margins: No    I, Tiphani Mells, PA-C, have reviewed all documentation's for this visit.  The documentation on 11/13/20 for the exam, diagnosis, procedures and orders are all accurate and complete.

## 2020-11-15 NOTE — Progress Notes (Signed)
Cardiology Office Note   Date:  11/16/2020   ID:  Natash Berman, DOB September 06, 1970, MRN [id number]  PCP:  London Pepper, MD  Cardiologist:   Minus Breeding, MD Referring:  Self   Chief Complaint  Patient presents with   Palpitations       History of Present Illness: Cynthia Nguyen is a 50 y.o. female who presents for evaluation of palpitations.     She had tachycardia at age 24 treated with beta blockers.  She came off of these in her mid 31s.  She was having more palpitations in 2015 and had a Holter with no significant arrhythmias.  She had a negative stress test in 2017.  Coronary calcium was zero.  Echo was normal.     She has had continued palpitations.  We tried to change her atenolol to 25 mg bid.  However, she felt anxious, nauseated and dizzy.  BP was low so we suggested reducing back down to 25 mg PO daily.    She has done quite well with the current regimen.  She is exercising routinely and doing triathlons. The patient denies any new symptoms such as chest discomfort, neck or arm discomfort. There has been no new shortness of breath, PND or orthopnea. There have been no reported palpitations, presyncope or syncope.    Past Medical History:  Diagnosis Date   Fibrocystic breast disease    Generalized anxiety disorder    Mitral valve prolapse    childhood   Scoliosis    per pt >60% curvature   Seasonal allergies     Past Surgical History:  Procedure Laterality Date   BREAST CYST ASPIRATION  08/16/2010   BREAST CYST ASPIRATION  12/25/2009   BREAST CYST ASPIRATION  07/22/2009   WISDOM TOOTH EXTRACTION       Current Outpatient Medications  Medication Sig Dispense Refill   atenolol (TENORMIN) 25 MG tablet Take 1 tablet (25 mg total) by mouth 2 (two) times daily. 60 tablet 6   calcium carbonate (OS-CAL) 600 MG TABS tablet Take 600 mg by mouth 2 (two) times daily with a meal.      Cholecalciferol (VITAMIN D3) 50 MCG (2000 UT) TABS Take 1 tablet by mouth daily.      diltiazem (CARDIZEM CD) 120 MG 24 hr capsule Take 1 capsule (120 mg total) by mouth daily. 90 capsule 3   estradiol (VIVELLE-DOT) 0.1 MG/24HR patch Place 1 patch onto the skin 2 (two) times a week.     progesterone (PROMETRIUM) 100 MG capsule Take 1 capsule by mouth daily.     No current facility-administered medications for this visit.    Allergies:   Cephalexin    ROS:  Please see the history of present illness.   Otherwise, review of systems are positive for noe.   All other systems are reviewed and negative.    PHYSICAL EXAM: VS:  BP 116/74   Pulse (!) 51   Ht 5\' 4"  (1.626 m)   Wt 149 lb 9.6 oz (67.9 kg)   SpO2 98%   BMI 25.68 kg/m  , BMI Body mass index is 25.68 kg/m. GENERAL:  Well appearing NECK:  No jugular venous distention, waveform within normal limits, carotid upstroke brisk and symmetric, no bruits, no thyromegaly LUNGS:  Clear to auscultation bilaterally CHEST:  Unremarkable HEART:  PMI not displaced or sustained,S1 and S2 within normal limits, no S3, no S4, no clicks, no rubs, no murmurs ABD:  Flat, positive bowel sounds normal in frequency  in pitch, no bruits, no rebound, no guarding, no midline pulsatile mass, no hepatomegaly, no splenomegaly EXT:  2 plus pulses throughout, no edema, no cyanosis no clubbing  EKG:  EKG is  ordered today. Normal sinus rhythm, rate 51, axis within normal limits, low voltage in the limb and chest leads, poor anterior R wave progression, no change from previous.  Recent Labs: No results found for requested labs within last 8760 hours.    Lipid Panel No results found for: CHOL, TRIG, HDL, CHOLHDL, VLDL, LDLCALC, LDLDIRECT    Wt Readings from Last 3 Encounters:  11/16/20 149 lb 9.6 oz (67.9 kg)  03/16/20 144 lb (65.3 kg)  11/18/19 143 lb 6.4 oz (65 kg)      Other studies Reviewed: Additional studies/ records that were reviewed today include:  Labs Review of the above records demonstrates:  Please see elsewhere in the  note.     ASSESSMENT AND PLAN:  PALPITATIONS:    These are not particular bothersome on the current regimen.  No change in therapy.   Current medicines are reviewed at length with the patient today.  The patient does not have concerns regarding medicines.  The following changes have been made:  None  Labs/ tests ordered today include:  None  Orders Placed This Encounter  Procedures   EKG 12-Lead      Disposition:   Follow up in one  year.    Signed, Minus Breeding, MD  11/16/2020 11:57 AM    [facility]

## 2020-11-16 ENCOUNTER — Encounter: Payer: Self-pay | Admitting: Cardiology

## 2020-11-16 ENCOUNTER — Telehealth: Payer: Self-pay | Admitting: *Deleted

## 2020-11-16 ENCOUNTER — Ambulatory Visit: Payer: Managed Care, Other (non HMO) | Admitting: Cardiology

## 2020-11-16 ENCOUNTER — Other Ambulatory Visit: Payer: Self-pay

## 2020-11-16 VITALS — BP 116/74 | HR 51 | Ht 64.0 in | Wt 149.6 lb

## 2020-11-16 DIAGNOSIS — R9431 Abnormal electrocardiogram [ECG] [EKG]: Secondary | ICD-10-CM | POA: Diagnosis not present

## 2020-11-16 DIAGNOSIS — R002 Palpitations: Secondary | ICD-10-CM

## 2020-11-16 NOTE — Patient Instructions (Signed)
Medication Instructions:  The current medical regimen is effective;  continue present plan and medications as directed. Please refer to the Current Medication list given to you today.   *If you need a refill on your cardiac medications before your next appointment, please call your pharmacy*  Lab Work:   Testing/Procedures:  NONE    NONE  Special Instructions NONE  Follow-Up: Your next appointment:  12 month(s) In Person with Minus Breeding, MD  or Rosaria Ferries, PA-C, Coletta Memos, FNP, Fabian Sharp, PA-C, Sande Rives, PA-C, Vikki Ports, PA-C, Caron Presume, PA-C, Jory Sims, DNP, ANP, Almyra Deforest, PA-C, or Diona Browner, NP      Please call our office 2 months in advance to schedule this appointment   At [facility], you and your health needs are our priority.  As part of our continuing mission to provide you with exceptional heart care, we have created designated Provider Care Teams.  These Care Teams include your primary Cardiologist (physician) and Advanced Practice Providers (APPs -  Physician Assistants and Nurse Practitioners) who all work together to provide you with the care you need, when you need it.

## 2020-11-16 NOTE — Telephone Encounter (Signed)
-----   Message from Warren Danes, [place] sent at 11/13/2020 11:38 AM EST ----- Moderate with positive margin. New guidelines. Ok to watch. Call if recurs.

## 2020-11-16 NOTE — Telephone Encounter (Signed)
Pathology to patient.  °

## 2020-12-02 IMAGING — MG MM DIGITAL DIAGNOSTIC UNILAT*R* W/ TOMO W/ CAD
4 series · 4 of 12 positions shown · non-contrast
Comparison: Previous exam(s).

CLINICAL DATA: 49-year-old female recalled from screening mammogram
dated 08/16/2019 for a possible right breast asymmetry.

EXAM:
DIGITAL DIAGNOSTIC UNILATERAL RIGHT MAMMOGRAM WITH TOMO AND CAD

[R MLO synth-2D]
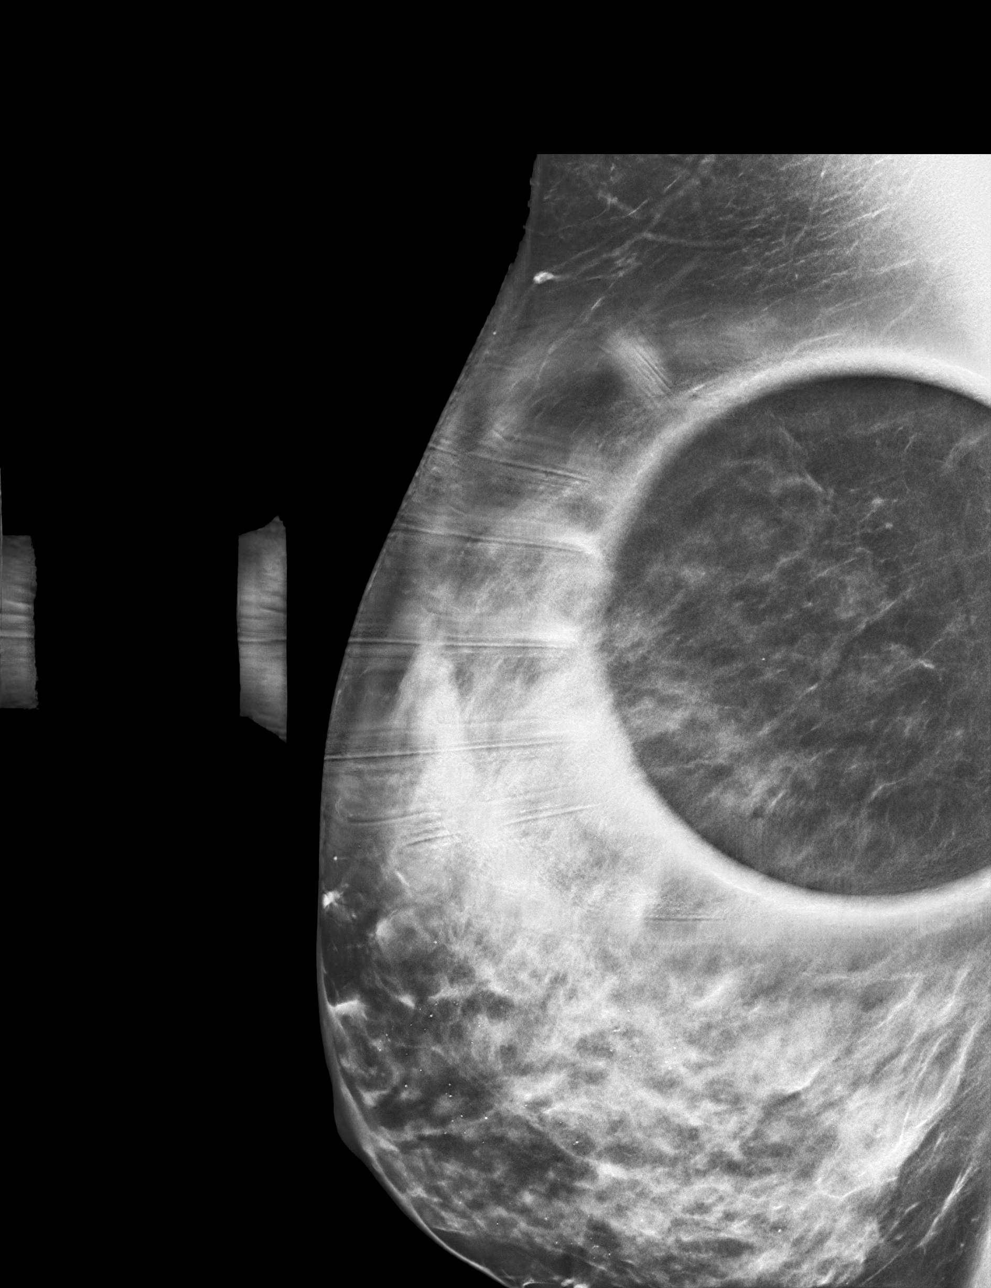

[R ML synth-2D]
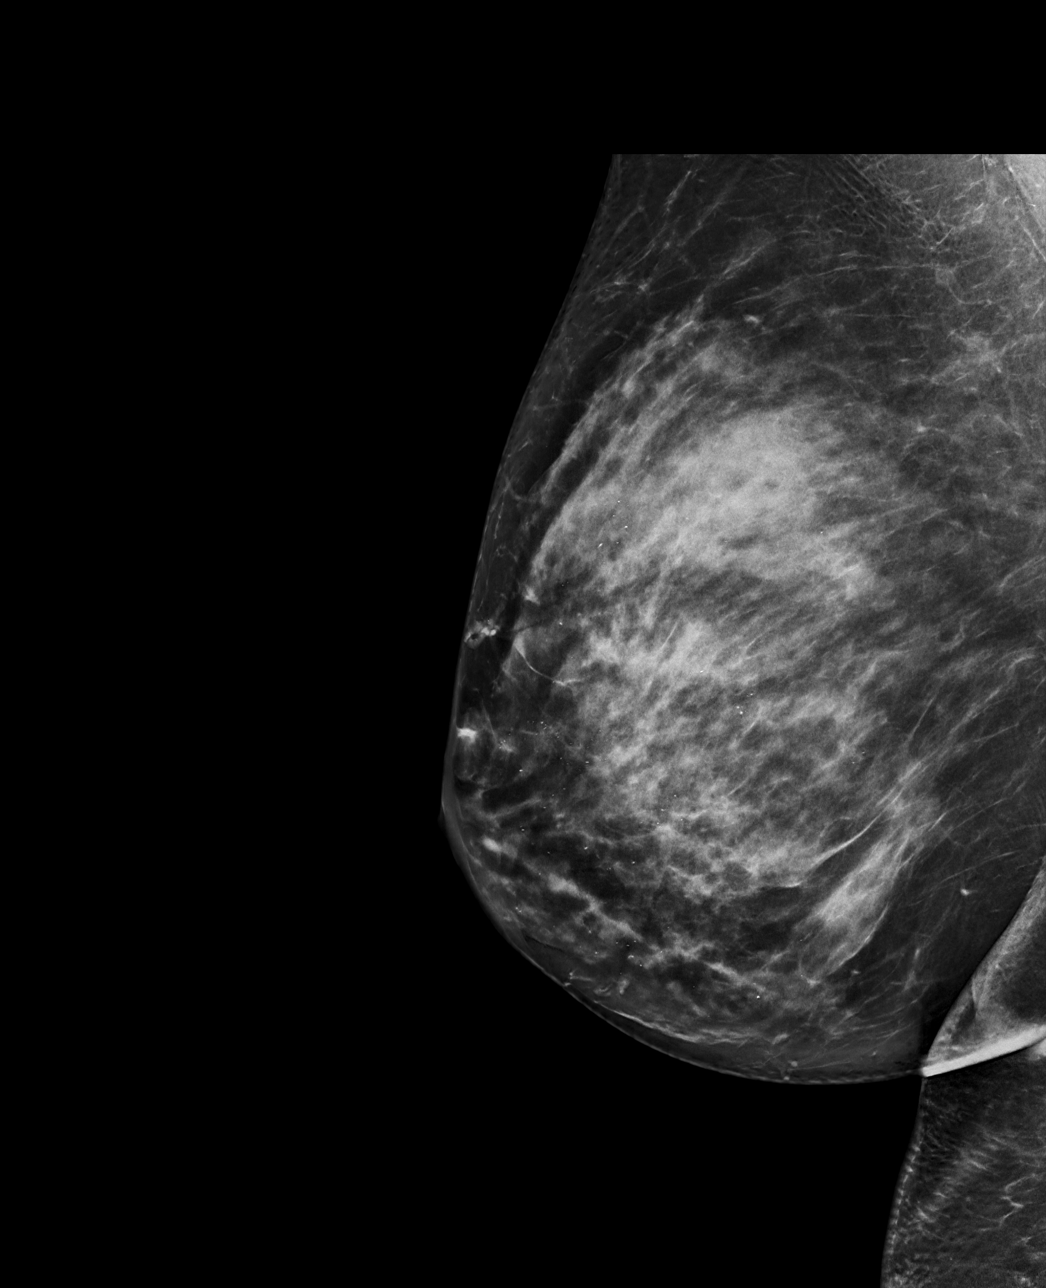

[R ML tomo · tomo slice 43/84.0]
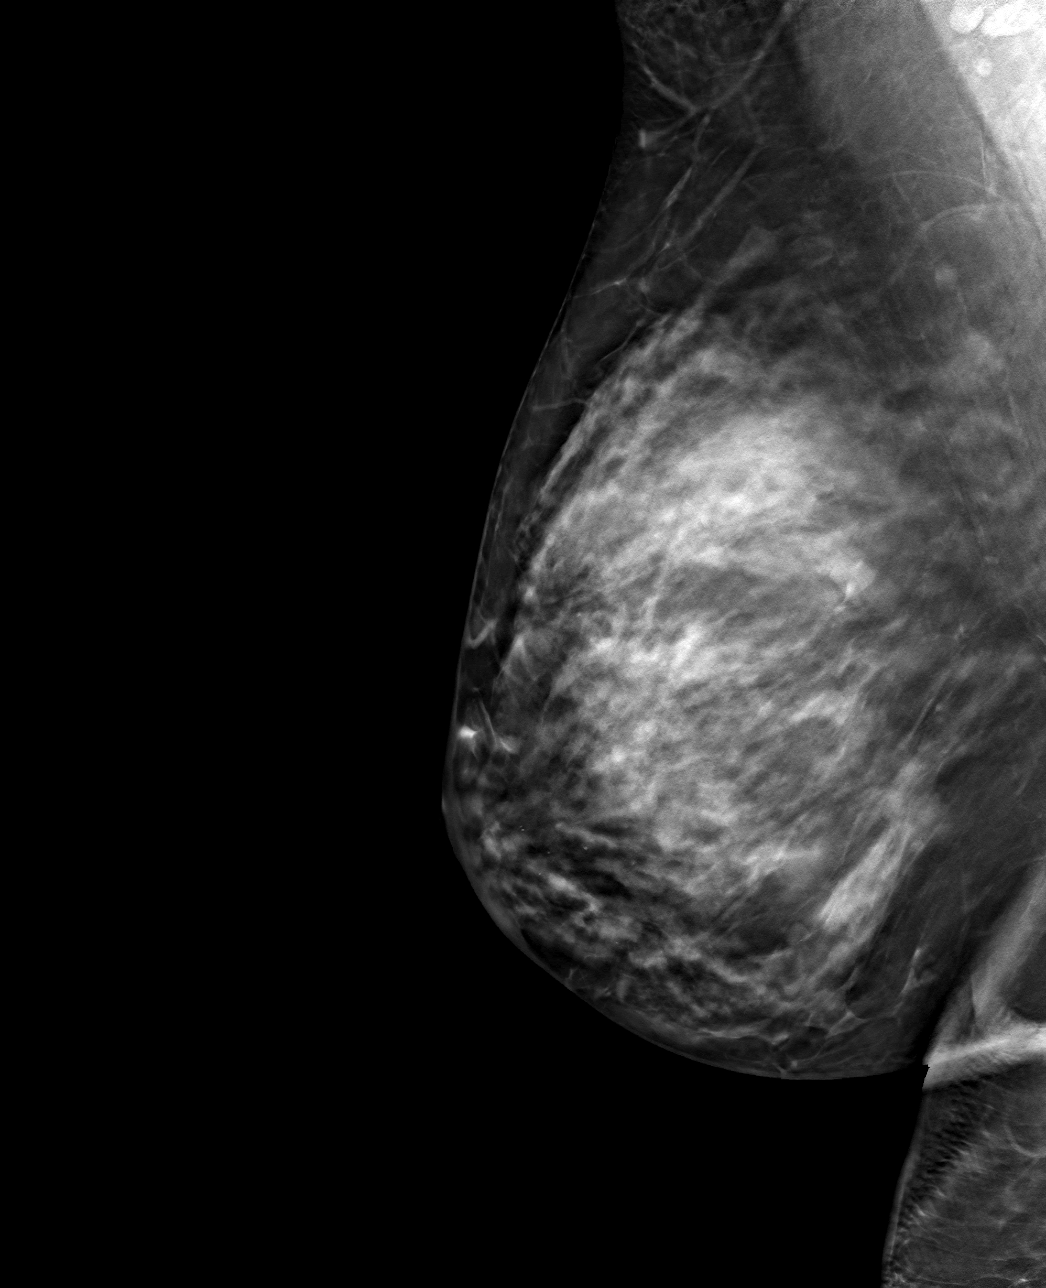

[R MLO tomo · tomo slice 39/76.0]
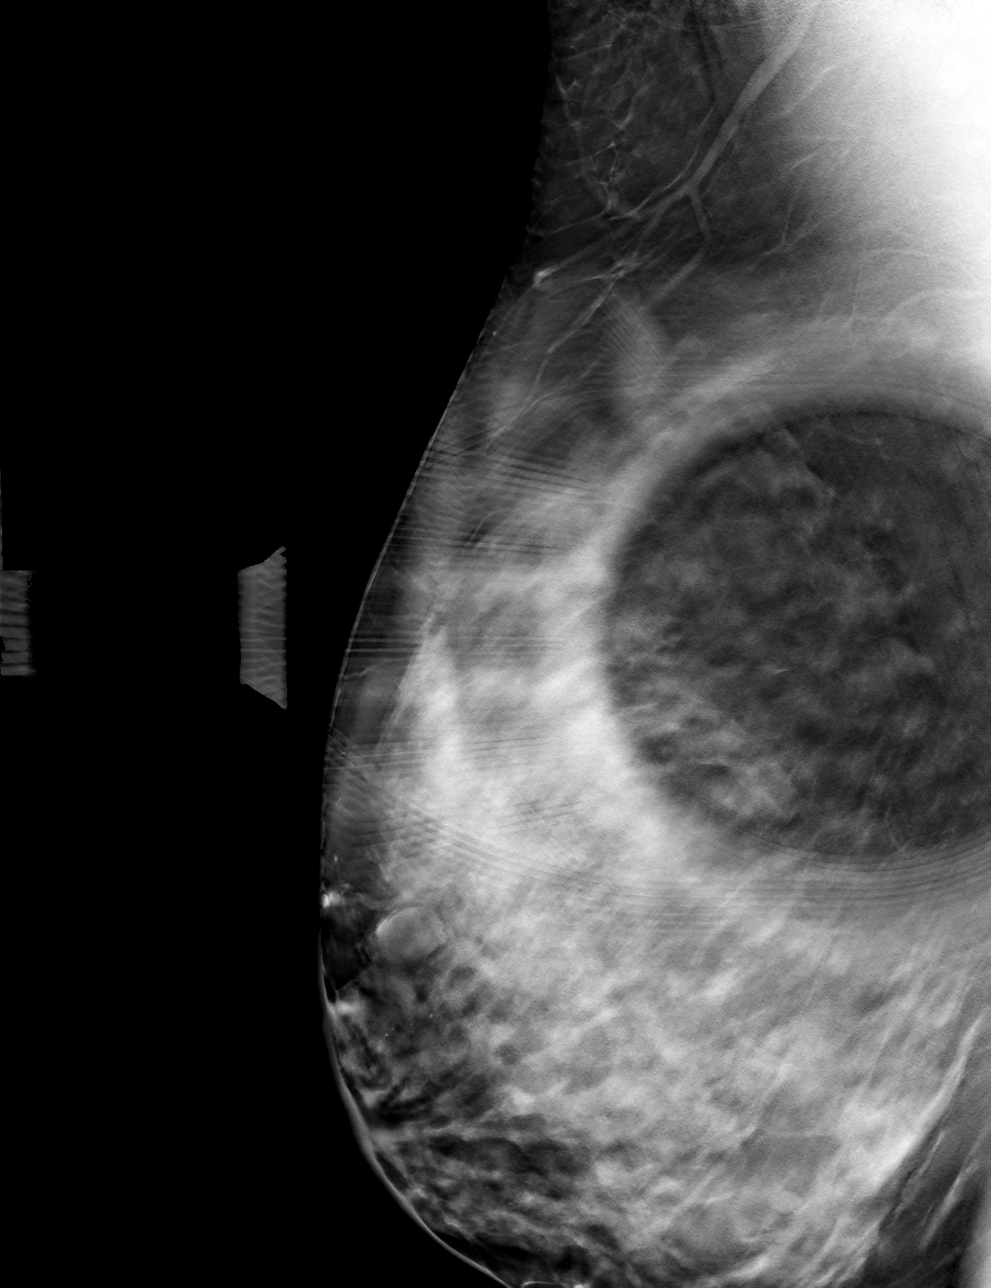

[4 of 12 positions shown; findings below may reference images not displayed]

ACR Breast Density Category d: The breast tissue is extremely dense,
which lowers the sensitivity of mammography.
FINDINGS: Previously described, possible asymmetry in the far posterior right
breast, as seen on the MLO projection only, effaces into
fibroglandular tissue on today's additional views. The parenchymal
pattern is stable when compared to multiple prior studies. No
suspicious findings are identified

Mammographic images were processed with CAD.
IMPRESSION: No mammographic evidence of malignancy.

RECOMMENDATION:
Screening mammogram in one year.(Code:[id number]-[id number])

I have discussed the findings and recommendations with the patient.
If applicable, a reminder letter will be sent to the patient
regarding the next appointment.

BI-RADS CATEGORY  1: Negative.

## 2021-01-11 ENCOUNTER — Encounter: Payer: Self-pay | Admitting: Cardiology

## 2021-01-26 ENCOUNTER — Other Ambulatory Visit: Payer: Self-pay | Admitting: Cardiology

## 2021-01-27 MED ORDER — ATENOLOL 25 MG PO TABS: 25.0000 mg | ORAL_TABLET | Freq: Two times a day (BID) | ORAL | 11 refills | Status: DC

## 2021-02-08 ENCOUNTER — Ambulatory Visit (INDEPENDENT_AMBULATORY_CARE_PROVIDER_SITE_OTHER): Payer: Managed Care, Other (non HMO)

## 2021-02-08 ENCOUNTER — Ambulatory Visit: Payer: Managed Care, Other (non HMO) | Admitting: Podiatry

## 2021-02-08 ENCOUNTER — Other Ambulatory Visit: Payer: Self-pay

## 2021-02-08 DIAGNOSIS — M216X1 Other acquired deformities of right foot: Secondary | ICD-10-CM | POA: Diagnosis not present

## 2021-02-08 DIAGNOSIS — M778 Other enthesopathies, not elsewhere classified: Secondary | ICD-10-CM

## 2021-02-08 DIAGNOSIS — M216X2 Other acquired deformities of left foot: Secondary | ICD-10-CM | POA: Diagnosis not present

## 2021-02-08 DIAGNOSIS — M79672 Pain in left foot: Secondary | ICD-10-CM

## 2021-02-08 DIAGNOSIS — G5762 Lesion of plantar nerve, left lower limb: Secondary | ICD-10-CM | POA: Diagnosis not present

## 2021-02-08 DIAGNOSIS — D3610 Benign neoplasm of peripheral nerves and autonomic nervous system, unspecified: Secondary | ICD-10-CM

## 2021-02-08 MED ORDER — MELOXICAM 15 MG PO TABS: 15.0000 mg | ORAL_TABLET | Freq: Every day | ORAL | 0 refills | Status: DC | PRN

## 2021-02-08 NOTE — Progress Notes (Signed)
SITUATION Reason for Consult: Evaluation for Bilateral Custom Foot Orthoses Patient / Caregiver Report: Patient is ready for foot orthotics  OBJECTIVE DATA: Patient History / Diagnosis:    ICD-10-CM   1. Left foot pain  M79.672       Current or Previous Devices: Historical foot orthotics user  Foot Examination: Skin presentation:   Intact Ulcers & Callousing:   None and no history Toe / Foot Deformities:  Pes planus Weight Bearing Presentation:  Planus Sensation:    Intact  ORTHOTIC RECOMMENDATION Recommended Device: 1x pair of custom functional foot orthotics  GOALS OF ORTHOSES - Reduce Pain - Prevent Foot Deformity - Prevent Progression of Further Foot Deformity - Relieve Pressure - Improve the Overall Biomechanical Function of the Foot and Lower Extremity.  ACTIONS PERFORMED Patient was casted for Foot Orthoses via crush box. Procedure was explained and patient tolerated procedure well. All questions were answered and concerns addressed.  PLAN Potential out of pocket cost was communicated to patient. Casts are to be sent to [facility] for fabrication. Patient is to be called for fitting when devices are ready.

## 2021-02-10 ENCOUNTER — Other Ambulatory Visit: Payer: Self-pay | Admitting: Podiatry

## 2021-02-10 DIAGNOSIS — M778 Other enthesopathies, not elsewhere classified: Secondary | ICD-10-CM

## 2021-02-12 NOTE — Progress Notes (Signed)
Subjective:   Patient ID: Cynthia Nguyen, female   DOB: 51 y.o.   MRN: [id number]   HPI 51 year old female presents the office today for concerns of pain to the bottom of her left foot which about 3 months ago.  She states that she gets a sharp pain going into the second and third toes.  Walking makes the pain worse.  She had a callus to the ball of the foot as well which is been ongoing for quite some time the pain is just adjacent to this.  She tried arch supports and resting without significant improvement.  No specific injury that she reports.  She has no other concerns.  Review of Systems  All other systems reviewed and are negative.  Past Medical History:  Diagnosis Date   Fibrocystic breast disease    Generalized anxiety disorder    Mitral valve prolapse    childhood   Scoliosis    per pt >60% curvature   Seasonal allergies     Past Surgical History:  Procedure Laterality Date   BREAST CYST ASPIRATION  08/16/2010   BREAST CYST ASPIRATION  12/25/2009   BREAST CYST ASPIRATION  07/22/2009   WISDOM TOOTH EXTRACTION       Current Outpatient Medications:    meloxicam (MOBIC) 15 MG tablet, Take 1 tablet (15 mg total) by mouth daily as needed for pain., Disp: 30 tablet, Rfl: 0   atenolol (TENORMIN) 25 MG tablet, Take 1 tablet (25 mg total) by mouth 2 (two) times daily., Disp: 60 tablet, Rfl: 11   calcium carbonate (OS-CAL) 600 MG TABS tablet, Take 600 mg by mouth 2 (two) times daily with a meal. , Disp: , Rfl:    Cholecalciferol (VITAMIN D3) 50 MCG (2000 UT) TABS, Take 1 tablet by mouth daily., Disp: , Rfl:    diltiazem (CARDIZEM CD) 120 MG 24 hr capsule, Take 1 capsule (120 mg total) by mouth daily., Disp: 90 capsule, Rfl: 3   estradiol (VIVELLE-DOT) 0.1 MG/24HR patch, Place 1 patch onto the skin 2 (two) times a week., Disp: , Rfl:    progesterone (PROMETRIUM) 100 MG capsule, Take 1 capsule by mouth daily., Disp: , Rfl:   Allergies  Allergen Reactions   Cephalexin      dizziness          Objective:  Physical Exam  General: AAO x3, NAD  Dermatological: Skin is warm, dry and supple bilateral.  There are no open sores, no preulcerative lesions, no rash or signs of infection present.  Vascular: Dorsalis Pedis artery and Posterior Tibial artery pedal pulses are 2/4 bilateral with immedate capillary fill time.  There is no pain with calf compression, swelling, warmth, erythema.   Neruologic: Grossly intact via light touch bilateral.  Negative Tinel sign.  Musculoskeletal: There is tenderness palpation on the second interspace of the left foot.  Small palpable neuroma is identified.  Is no area of pinpoint tenderness.  There is a hyperkeratotic lesion submetatarsal 2 but this is away from the area without tenderness is localized.  Flexor, extensor tendons appear to be intact.  Muscular strength 5/5 in all groups tested bilateral.  Gait: Unassisted, Nonantalgic.       Assessment:   Left second interspace neuroma     Plan:  -Treatment options discussed including all alternatives, risks, and complications -Etiology of symptoms were discussed -Prescribed mobic. Discussed side effects of the medication and directed to stop if any are to occur and call the office.  -Discussed steroid injection and  she was to proceed.  Skin prepped with alcohol and a mixture of 1 cc Kenalog 10, 0.5 cc of Marcaine plain, 0.5 cc of lidocaine plain was infiltrated into the second interspace without complications.  Postinjection care discussed. -Offloading pads.  Discussed shoes and good arch support.  Orthotics.  Trula Slade DPM

## 2021-02-24 ENCOUNTER — Telehealth: Payer: Self-pay | Admitting: Cardiology

## 2021-02-24 NOTE — Telephone Encounter (Signed)
Pt c/o medication issue:  1. Name of Medication:  Propanolol 10 mg Propanolol ER 80 mg capsule  2. How are you currently taking this medication (dosage and times per day)? Need to verify if she needs to still take it  3. Are you having a reaction (difficulty breathing--STAT)? no  4. What is your medication issue? Anaya from [facility] states the patient had her prescriptions transferred to them and called for a refill. The propanolol is not currently listed in her chart so they would like to verify whether she is supposed to stop taking it. Phone: [phone]

## 2021-02-24 NOTE — Telephone Encounter (Signed)
Left detailed message on pt Vm. Is she using propranolol. This was taken off at November appt. Are you still using? Monitor note-stop and conservative treatment-stay hydrated.

## 2021-02-26 NOTE — Telephone Encounter (Signed)
Left message to call back-does she take propranolol? Not on her medication list

## 2021-03-03 ENCOUNTER — Encounter: Payer: Self-pay | Admitting: Cardiology

## 2021-03-09 ENCOUNTER — Telehealth: Payer: Self-pay | Admitting: Adult Health

## 2021-03-09 NOTE — Telephone Encounter (Signed)
LVM and sent mychart msg informing pt of appt change- NP out 3/20. ?

## 2021-03-11 NOTE — Telephone Encounter (Signed)
Left message for patient to return the call.

## 2021-03-16 ENCOUNTER — Telehealth: Payer: Self-pay

## 2021-03-16 NOTE — Telephone Encounter (Signed)
Lvm for pt to call back to schedule orthotic pick up

## 2021-03-22 ENCOUNTER — Ambulatory Visit: Payer: Managed Care, Other (non HMO)

## 2021-03-22 ENCOUNTER — Ambulatory Visit: Payer: Managed Care, Other (non HMO) | Admitting: Adult Health

## 2021-03-22 ENCOUNTER — Other Ambulatory Visit: Payer: Self-pay

## 2021-03-22 DIAGNOSIS — D3610 Benign neoplasm of peripheral nerves and autonomic nervous system, unspecified: Secondary | ICD-10-CM

## 2021-03-22 DIAGNOSIS — M79672 Pain in left foot: Secondary | ICD-10-CM

## 2021-03-22 NOTE — Progress Notes (Signed)
SITUATION: ?Reason for Visit: Fitting and Delivery of Custom Fabricated Foot Orthoses ?Patient Report: Patient reports comfort and is satisfied with device. ? ?OBJECTIVE DATA: ?Patient History / Diagnosis:   ?  ICD-10-CM   ?1. Left foot pain  M79.672   ?  ?2. Neuroma  D36.10   ?  ? ? ?Provided Device:  Custom Functional Foot Orthotics ?    [facility]: [id number] ? ?GOAL OF ORTHOSIS ?- Improve gait ?- Decrease energy expenditure ?- Improve Balance ?- Provide Triplanar stability of foot complex ?- Facilitate motion ? ?ACTIONS PERFORMED ?Patient was fit with foot orthotics trimmed to shoe last. Patient tolerated fittign procedure.  ? ?Patient was provided with verbal and written instruction and demonstration regarding donning, doffing, wear, care, proper fit, function, purpose, cleaning, and use of the orthosis and in all related precautions and risks and benefits regarding the orthosis. ? ?Patient was also provided with verbal instruction regarding how to report any failures or malfunctions of the orthosis and necessary follow up care. Patient was also instructed to contact our office regarding any change in status that may affect the function of the orthosis. ? ?Patient demonstrated independence with proper donning, doffing, and fit and verbalized understanding of all instructions. ? ?PLAN: ?Patient is to follow up in one week or as necessary (PRN). All questions were answered and concerns addressed. Plan of care was discussed with and agreed upon by the patient. ? ?

## 2021-03-29 ENCOUNTER — Encounter: Payer: Self-pay | Admitting: Cardiology

## 2021-03-29 MED ORDER — ATENOLOL 25 MG PO TABS: 25.0000 mg | ORAL_TABLET | Freq: Two times a day (BID) | ORAL | 3 refills | Status: DC

## 2021-04-06 ENCOUNTER — Ambulatory Visit: Payer: Managed Care, Other (non HMO) | Admitting: Adult Health

## 2021-04-06 ENCOUNTER — Encounter: Payer: Self-pay | Admitting: Adult Health

## 2021-04-06 VITALS — BP 90/57 | HR 50 | Ht 64.0 in | Wt 143.0 lb

## 2021-04-06 DIAGNOSIS — G479 Sleep disorder, unspecified: Secondary | ICD-10-CM

## 2021-04-06 DIAGNOSIS — G478 Other sleep disorders: Secondary | ICD-10-CM

## 2021-04-06 NOTE — Patient Instructions (Addendum)
Your Plan: ? ?Continue Melatonin  ?If your symptoms worsen or you develop new symptoms please let us know.  ? ?Thank you for coming to see [place] at [facility]. I hope we have been able to provide you high quality care today. ? ?You may receive a patient satisfaction survey over the next few weeks. We would appreciate your feedback and comments so that we may continue to improve ourselves and the health of our patients. ? ?

## 2021-04-06 NOTE — Progress Notes (Signed)
? ? ?PATIENT: Cynthia Nguyen ?DOB: 1970-03-15 ? ?REASON FOR VISIT: follow up ?HISTORY FROM: patient ?PRIMARY NEUROLOGIST: Cynthia. Brett Fairy ?Chief Complaint  ?Patient presents with  ? Follow-up  ?  Pt in 19  follow up visit for non restorative sleep/insomnia . Pt states she is getting better. Pt states that she has a regular sleep schedule and she takes  melatonin 3 mg nightly. Pt states she does feel rested. Pt states she still has palpitations.  ? ? ? ?HISTORY OF PRESENT ILLNESS: ?Today 04/06/21: ? ?Cynthia Nguyen is a 51 year old female with a history of sleep disturbance.  She returns today for follow-up.  She has had sleep study that did not show sleep apnea but this show dysfunctions associated with arousal from REM sleep. now just taking Melatonin 3 mg. Feels better now. On occasion she feels that she wakes up with SOB but feels its related to stress/anixety. Feels that she still has palpitations at times but not has often. 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  Overall she feels more rested.  ? ?HISTORY (Copied from CynthiaDohmeier's note) ?Cynthia Nguyen, Cynthia Nguyen  is a 51 year- old female of [place] descent is seen here in a RV;03-16-2020, ? 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 She sleeps 6 hours at night and will take a power nap.  ?Epworth score at 12/ 24  points. FSS at 13 points. Refill Trazodone.  ? ?  ?  ? 10-28-2019 ?Cynthia Nguyen underwent a procedure on 09-02-2019 when her sleep study and baseline showed no clinical significance degree of obstructive sleep apnea, she did not have periodic limb movements but she had dysfunctions associated with arousal of the REM sleep meaning that she wakes directly out of dream sleep without resuming sleep first and then non-REM stage.  ? This is a possible explanation for spontaneous arousals especially those that are preceded by other  vivid dreams / abnormal dreams.  We see also more REM sleep related arousals in patients with anxiety or depression. ?  Some medications will help to suppress the proportion of REM sleep however that is not necessarily meaning that the patient sleeps better or more restorative.  Her medications as listed in her initial consultation have slightly changed she is taking propanolol, Flonase as needed she is also on 0.05 mg estradiol patch the so-called to be well developed.  In addition this progesterone 50 mg.  She is currently weaning off Estrogen /Progesterone and is down to 1/4 of her previous dose- has no menopausal symptoms at this point, feels well. She is finally acknowledging same anxiety to be present.  ?  ?  ?  ?  ? 08-12-2019. Originally sent  from Cynthia Percival Spanish  for a sleep work up.   ?Chief concern according to patient : " I have palpitations all day long. When I get enough sleep the palpitations are less noticeable. I take beta blockers and melatonin to help fall asleep".  "I now have to avoid all caffeine and ETOH". ?I have the pleasure of seeing Cynthia Nguyen today, a right -handed Other or two or more races female with a possible sleep disorder. She  has a past medical history of Fibrocystic breast disease, Generalized anxiety disorder, Mitral valve prolapse, Scoliosis, and Seasonal allergies.  she has scoliosis, no surgical treatment.  ?Sleep relevant medical history: palpitations, insomnia. Averaging 5.5  hours of sleep per fit bit. No Tonsillectomy but large tonsils- snoring . A deviated septum was found by Cynthia Wilburn Cornelia.  ?  ?Family medical /sleep history:  Mother with insomnia, and RLS.  her insomnia started after her husband passed away.  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 Living with mother and partner. The patient currently works in her own office. Pets are present, 2 small dogs.  Marland Kitchen ?Tobacco use- none.  ETOH use - rarely , Caffeine intake is much reduced - Regular exercise in form of swimming- helps with scoliosis. Some walking, some weight lifting.  ?  ?  ?Sleep habits are as follows:  ?The patient's dinner time is between 5-6  PM. The patient goes to bed at 10.30 PM and asleep by 11 PM- she continues to sleep for 5-6 hours, wakes the first time at 3-4 AM.   ?The preferred sleep position is prone , with the support of no pillows.  ?Dreams are reportedly infrequent. ?6.30  AM is the usual rise time. The patient wakes up spontaneously at 4- often that's the end of sleep. Often with palpitations.  ?She reports not feeling refreshed or restored in AM, with symptoms such as dry mouth, nasal congestion,  headaches in daytime-and residual fatigue. ? Naps are taken frequently, but she is doing less of it- lasting from 2-3 hours minutes and are more refreshing than nocturnal sleep. They make sleep the following night almost impossibel.  ?  ? ?REVIEW OF SYSTEMS: Out of a complete 14 system review of symptoms, the patient complains only of the following symptoms, and all other reviewed systems are negative. ? ?ALLERGIES: ?Allergies  ?Allergen Reactions  ? Cephalexin   ?  dizziness  ? ? ?HOME MEDICATIONS: ?Outpatient Medications Prior to Visit  ?Medication Sig Dispense Refill  ? atenolol (TENORMIN) 25 MG tablet Take 1 tablet (25 mg total) by mouth 2 (two) times daily. 180 tablet 3  ? Biotin w/ Vitamins C & E (HAIR/SKIN/NAILS PO) Take by mouth daily.    ? calcium carbonate (OS-CAL) 600 MG TABS tablet Take 600 mg by mouth 2 (two) times daily with a meal.     ? Cholecalciferol (VITAMIN D3) 50 MCG (2000 UT) TABS Take 1 tablet by mouth daily.    ? diltiazem (CARDIZEM CD) 120 MG 24 hr capsule Take 1 capsule (120 mg total) by mouth daily. 90 capsule 3  ? estradiol (VIVELLE-DOT) 0.1 MG/24HR patch Place 1 patch onto the skin 2 (two) times a week.    ? MELATONIN PO Take 3 mg by mouth at bedtime.    ? progesterone  (PROMETRIUM) 100 MG capsule Take 1 capsule by mouth daily.    ? meloxicam (MOBIC) 15 MG tablet Take 1 tablet (15 mg total) by mouth daily as needed for pain. 30 tablet 0  ? ?No facility-administered medications prior to visit.  ? ? ?PAST MEDICAL HISTORY: ?Past Medical History:  ?Diagnosis Date  ? Fibrocystic breast disease   ? Generalized anxiety disorder   ? Mitral valve prolapse   ? childhood  ? Scoliosis   ? per pt >60% curvature  ? Seasonal allergies   ? ? ?PAST SURGICAL HISTORY: ?Past Surgical History:  ?Procedure Laterality Date  ? BREAST CYST ASPIRATION  08/16/2010  ? BREAST CYST ASPIRATION  12/25/2009  ? BREAST CYST ASPIRATION  07/22/2009  ? WISDOM TOOTH EXTRACTION    ? ? ?  FAMILY HISTORY: ?Family History  ?Problem Relation Age of Onset  ? Colon polyps Mother   ? Hypertension Mother   ? Insomnia Mother   ? Atrial fibrillation Father   ? Diabetes Father   ? Colon cancer Father   ? Insomnia Father   ? Emphysema Paternal Uncle   ? Stomach cancer Neg Hx   ? Rectal cancer Neg Hx   ? ? ?SOCIAL HISTORY: ?Social History  ? ?Socioeconomic History  ? Marital status: Single  ?  Spouse name: Not on file  ? Number of children: Not on file  ? Years of education: Not on file  ? Highest education level: Not on file  ?Occupational History  ? Occupation: Pharmacist, community  ?  [employer] farm manager: [employer]  ?Tobacco Use  ? Smoking status: Never  ? Smokeless tobacco: Never  ?Substance and Sexual Activity  ? Alcohol use: Yes  ?  Alcohol/week: 2.0 standard drinks  ?  Types: 2 Standard drinks or equivalent per week  ?  Comment: occ  ? Drug use: No  ? Sexual activity: Not on file  ?Other Topics Concern  ? Not on file  ?Social History Narrative  ? Not on file  ? ?Social Determinants of Health  ? ?Financial Resource Strain: Not on file  ?Food Insecurity: Not on file  ?Transportation Needs: Not on file  ?Physical Activity: Not on file  ?Stress: Not on file  ?Social Connections: Not on file  ?Intimate Partner Violence: Not on file  ? ? ? ? ?PHYSICAL  EXAM ? ?Vitals:  ? 04/06/21 0752  ?BP: (!) 90/57  ?Pulse: (!) 50  ?Weight: 143 lb (64.9 kg)  ?Height: '5\' 4"'$  (1.626 m)  ? ?Body mass index is 24.55 kg/m?. ? ?Generalized: Well developed, in no acute distress  ?

## 2021-05-26 ENCOUNTER — Telehealth: Payer: Self-pay | Admitting: *Deleted

## 2021-05-26 NOTE — Telephone Encounter (Signed)
Patient is calling because she is still in a lot of pain after purchasing /wearing her Orthotics. Please advise.

## 2021-06-03 ENCOUNTER — Other Ambulatory Visit: Payer: Managed Care, Other (non HMO)

## 2021-06-08 ENCOUNTER — Ambulatory Visit: Payer: Managed Care, Other (non HMO)

## 2021-06-08 DIAGNOSIS — M79672 Pain in left foot: Secondary | ICD-10-CM

## 2021-06-08 DIAGNOSIS — D3610 Benign neoplasm of peripheral nerves and autonomic nervous system, unspecified: Secondary | ICD-10-CM

## 2021-06-08 NOTE — Progress Notes (Signed)
SITUATION Reason for Consult: Follow-up with foot orthotics Patient / Caregiver Report: Patient reports painful burning sensation after more than a few hours of wear time  OBJECTIVE DATA History / Diagnosis:    ICD-10-CM   1. Left foot pain  M79.672     2. Neuroma  D36.10       Change in Pathology: None  ACTIONS PERFORMED Patient's equipment was checked for structural stability and fit. Metpad is likely causing excessive pressure resulting in neuroma agrivation. While this is uncommon and a metpad usually relieves neuroma pain, in this instance it needs to be removed. Devices returned to manufacturer for adjustment.   PLAN Patient to return in six weeks for re-fitting. Plan of care discussed with and agreed upon by patient / caregiver.

## 2021-07-23 ENCOUNTER — Ambulatory Visit (INDEPENDENT_AMBULATORY_CARE_PROVIDER_SITE_OTHER): Payer: Managed Care, Other (non HMO)

## 2021-07-23 DIAGNOSIS — M79672 Pain in left foot: Secondary | ICD-10-CM

## 2021-07-23 NOTE — Progress Notes (Signed)
Patient presents today to pick up custom molded foot orthotics, diagnosed with left foot pain by Dr. Jacqualyn Posey.   Orthotics were dispensed and fit was satisfactory. Reviewed instructions for break-in and wear. Written instructions given to patient.  Patient will follow up as needed.   [facility] - order # [id number]

## 2021-08-02 ENCOUNTER — Other Ambulatory Visit: Payer: Self-pay | Admitting: Cardiology

## 2021-08-24 ENCOUNTER — Ambulatory Visit: Payer: Managed Care, Other (non HMO) | Admitting: Physician Assistant

## 2021-08-25 ENCOUNTER — Encounter: Payer: Self-pay | Admitting: Podiatry

## 2021-08-28 NOTE — Telephone Encounter (Signed)
Cynthia Nguyen, can you schedule her? Thanks.

## 2021-09-01 NOTE — Telephone Encounter (Signed)
Left message on patients voicemail to schedule appt .

## 2021-09-08 NOTE — Telephone Encounter (Signed)
2nd attempt to reach patient for appointment.

## 2021-09-08 NOTE — Telephone Encounter (Signed)
Scheduled 10/18/21 and on wait list for earlier appt .

## 2021-09-30 ENCOUNTER — Other Ambulatory Visit: Payer: Self-pay | Admitting: Family Medicine

## 2021-09-30 DIAGNOSIS — Z1231 Encounter for screening mammogram for malignant neoplasm of breast: Secondary | ICD-10-CM

## 2021-10-14 ENCOUNTER — Ambulatory Visit: Payer: Managed Care, Other (non HMO) | Admitting: Podiatry

## 2021-10-14 DIAGNOSIS — D3610 Benign neoplasm of peripheral nerves and autonomic nervous system, unspecified: Secondary | ICD-10-CM

## 2021-10-14 MED ORDER — TRIAMCINOLONE ACETONIDE 10 MG/ML IJ SUSP
10.0000 mg | Freq: Once | INTRAMUSCULAR | Status: AC
  Administered 2021-10-14: 10 mg

## 2021-10-14 MED ORDER — MELOXICAM 15 MG PO TABS: 15.0000 mg | ORAL_TABLET | Freq: Every day | ORAL | 0 refills | Status: DC | PRN

## 2021-10-14 NOTE — Patient Instructions (Addendum)
While at your visit today you received a steroid injection in your foot or ankle to help with your pain. Along with having the steroid medication there is some "numbing" medication in the shot that you received. Due to this you may notice some numbness to the area for the next couple of hours.   I would recommend limiting activity for the next few days to help the steroid injection take affect.    The actually benefit from the steroid injection may take up to 2-7 days to see a difference. You may actually experience a small (as in 10%) INCREASE in pain in the first 24 hours---that is common. It would be best if you can ice the area today and take anti-inflammatory medications (such as Ibuprofen, Motrin, or Aleve) if you are able to take these medications. If you were prescribed another medication to help with the pain go ahead and start that medication today    Things to watch out for that you should contact us or a health care provider urgently would include: 1. Unusual (as in more than 10%) increase in pain 2. New fever > 101.5 3. New swelling or redness of the injected area.  4. Streaking of red lines around the area injected.  If you have any questions or concerns about this, please give our office a call at [phone].    Meloxicam Tablets What is this medication? MELOXICAM (mel OX i cam) treats mild to moderate pain, inflammation, or arthritis. It works by decreasing inflammation. It belongs to a group of medications called NSAIDs. This medicine may be used for other purposes; ask your health care provider or pharmacist if you have questions. COMMON BRAND NAME(S): Mobic What should I tell my care team before I take this medication? They need to know if you have any of these conditions: Asthma (lung or breathing disease) Bleeding disorder Coronary artery bypass graft (CABG) within the past 2 weeks Dehydration Heart attack Heart disease Heart failure High blood pressure If you  often drink alcohol Kidney disease Liver disease Smoke tobacco cigarettes Stomach bleeding Stomach ulcers, other stomach or intestine problems Take medications that treat or prevent blood clots Taking other steroids like dexamethasone or prednisone An unusual or allergic reaction to meloxicam, other medications, foods, dyes, or preservatives Pregnant or trying to get pregnant Breast-feeding How should I use this medication? Take this medication by mouth. Take it as directed on the prescription label at the same time every day. You can take it with or without food. If it upsets your stomach, take it with food. Do not use it more often than directed. There may be unused or extra doses in the bottle after you finish your treatment. Talk to your care team if you have questions about your dose. A special MedGuide will be given to you by the pharmacist with each prescription and refill. Be sure to read this information carefully each time. Talk to your care team about the use of this medication in children. Special care may be needed. Patients over 52 years of age may have a stronger reaction and need a smaller dose. Overdosage: If you think you have taken too much of this medicine contact a poison control center or emergency room at once. NOTE: This medicine is only for you. Do not share this medicine with others. What if I miss a dose? If you miss a dose, take it as soon as you can. If it is almost time for your next dose, take only that  dose. Do not take double or extra doses. What may interact with this medication? Do not take this medication with any of the following: Cidofovir Ketorolac This medication may also interact with the following: Aspirin and aspirin-like medications Certain medications for blood pressure, heart disease, irregular heart beat Certain medications for depression, anxiety, or psychotic disturbances Certain medications that treat or prevent blood clots like warfarin,  enoxaparin, dalteparin, apixaban, dabigatran, rivaroxaban Cyclosporine Diuretics Fluconazole Lithium Methotrexate Other NSAIDs, medications for pain and inflammation, like ibuprofen and naproxen Pemetrexed This list may not describe all possible interactions. Give your health care provider a list of all the medicines, herbs, non-prescription drugs, or dietary supplements you use. Also tell them if you smoke, drink alcohol, or use illegal drugs. Some items may interact with your medicine. What should I watch for while using this medication? Visit your care team for regular checks on your progress. Tell your care team if your symptoms do not start to get better or if they get worse. Do not take other medications that contain aspirin, ibuprofen, or naproxen with this medication. Side effects such as stomach upset, nausea, or ulcers may be more likely to occur. Many non-prescription medications contain aspirin, ibuprofen, or naproxen. Always read labels carefully. This medication can cause serious ulcers and bleeding in the stomach. It can happen with no warning. Smoking, drinking alcohol, older age, and poor health can also increase risks. Call your care team right away if you have stomach pain or blood in your vomit or stool. This medication does not prevent a heart attack or stroke. This medication may increase the chance of a heart attack or stroke. The chance may increase the longer you use this medication or if you have heart disease. If you take aspirin to prevent a heart attack or stroke, talk to your care team about using this medication. Alcohol may interfere with the effect of this medication. Avoid alcoholic drinks. This medication may cause serious skin reactions. They can happen weeks to months after starting the medication. Contact your care team right away if you notice fevers or flu-like symptoms with a rash. The rash may be red or purple and then turn into blisters or peeling of the  skin. Or, you might notice a red rash with swelling of the face, lips or lymph nodes in your neck or under your arms. Talk to your care team if you are pregnant before taking this medication. Taking this medication between weeks 20 and 30 of pregnancy may harm your unborn baby. Your care team will monitor you closely if you need to take it. After 30 weeks of pregnancy, do not take this medication. You may get drowsy or dizzy. Do not drive, use machinery, or do anything that needs mental alertness until you know how this medication affects you. Do not stand up or sit up quickly, especially if you are an older patient. This reduces the risk of dizzy or fainting spells. Be careful brushing or flossing your teeth or using a toothpick because you may get an infection or bleed more easily. If you have any dental work done, tell your dentist you are receiving this medication. This medication may make it more difficult to get pregnant. Talk to your care team if you are concerned about your fertility. What side effects may I notice from receiving this medication? Side effects that you should report to your care team as soon as possible: Allergic reactions--skin rash, itching, hives, swelling of the face, lips, tongue, or throat  Bleeding--bloody or black, tar-like stools, vomiting blood or brown material that looks like coffee grounds, red or dark brown urine, small red or purple spots on skin, unusual bruising or bleeding Heart attack--pain or tightness in the chest, shoulders, arms, or jaw, nausea, shortness of breath, cold or clammy skin, feeling faint or lightheaded Heart failure--shortness of breath, swelling of ankles, feet, or hands, sudden weight gain, unusual weakness or fatigue Increase in blood pressure Kidney injury--decrease in the amount of urine, swelling of the ankles, hands, or feet Liver injury--right upper belly pain, loss of appetite, nausea, light-colored stool, dark yellow or brown urine,  yellowing skin or eyes, unusual weakness or fatigue Rash, fever, and swollen lymph nodes Redness, blistering, peeling, or loosening of the skin, including inside the mouth Stroke--sudden numbness or weakness of the face, arm, or leg, trouble speaking, confusion, trouble walking, loss of balance or coordination, dizziness, severe headache, change in vision Side effects that usually do not require medical attention (report to your care team if they continue or are bothersome): Diarrhea Nausea Upset stomach This list may not describe all possible side effects. Call your doctor for medical advice about side effects. You may report side effects to FDA at 1-800-FDA-1088. Where should I keep my medication? Keep out of the reach of children and pets. Store at room temperature between 20 and 25 degrees C (68 and 77 degrees F). Protect from moisture. Keep the container tightly closed. Get rid of any unused medication after the expiration date. To get rid of medications that are no longer needed or have expired: Take the medication to a medication take-back program. Check with your pharmacy or law enforcement to find a location. If you cannot return the medication, check the label or package insert to see if the medication should be thrown out in the garbage or flushed down the toilet. If you are not sure, ask your care team. If it is safe to put it in the trash, empty the medication out of the container. Mix the medication with cat litter, dirt, coffee grounds, or other unwanted substance. Seal the mixture in a bag or container. Put it in the trash. NOTE: This sheet is a summary. It may not cover all possible information. If you have questions about this medicine, talk to your doctor, pharmacist, or health care provider.  2023 Elsevier/Gold Standard (2007-02-10 00:00:00) [place] Lathe Neuralgia  Morton neuralgia is foot pain that affects the ball of the foot and the area near the toes. Morton neuralgia occurs  when part of a nerve in the foot (digital nerve) is under too much pressure (compressed). When this happens over a long period of time, the nerve can thicken (neuroma) and cause pain. Pain usually occurs between the third and fourth toes.  Morton neuralgia can come and go but may get worse over time. What are the causes? This condition is caused by doing the same things over and over with your foot, such as: Activities such as running or jumping. Wearing shoes that are too tight. What increases the risk? You may be at higher risk for Morton neuralgia if you: Are female. Wear high heels. Wear shoes that are narrow or tight. Do activities that repeatedly stretch your toes, such as: Running. Ballet. Long-distance walking. What are the signs or symptoms? The first symptom of Morton neuralgia is pain that spreads from the ball of the foot to the toes. It may feel like you are walking on a marble. Pain usually gets worse with walking and  goes away at night. Other symptoms may include numbness and cramping of your toes. Both feet are equally affected, but rarely at the same time. How is this diagnosed? This condition is diagnosed based on your symptoms, your medical history, and a physical exam. Your health care provider may: Squeeze your foot just behind your toe. Ask you to move your toes to check for pain. Ask about your physical activity level. You also may have imaging tests, such as an X-ray, ultrasound, or MRI. How is this treated? Treatment depends on how severe your condition is and what causes it. Treatment may involve: Wearing different shoes that are not too tight, are low-heeled, and provide good support. For some people, this is the only treatment needed. Wearing an over-the-counter or custom supportive pad (orthotic) under the front of your foot. Getting injections of numbing medicine and anti-inflammatory medicine (steroid) in the nerve. Having surgery to remove part of the  thickened nerve. Follow these instructions at home: Managing pain, stiffness, and swelling  Massage your foot as needed. Wear orthotics as told by your health care provider. If directed, put ice on the painful area. To do this: Put ice in a plastic bag. Place a towel between your skin and the bag. Leave the ice on for 20 minutes, 2-3 times a day. Remove the ice if your skin turns bright red. This is very important. If you cannot feel pain, heat, or cold, you have a greater risk of damage to the area. Raise (elevate) the injured area above the level of your heart while you are sitting or lying down. Avoid activities that cause pain or make pain worse. If you play sports, ask your health care provider when it is safe for you to return to sports. General instructions Take over-the-counter and prescription medicines only as told by your health care provider. For the time period you were told by your health care provider, do not drive or use machinery. Wear shoes that: Have soft soles. Have a wide toe area. Provide arch support. Do not pinch or squeeze your feet. Have room for your orthotics, if this applies. Keep all follow-up visits. This is important. Contact a health care provider if: Your symptoms get worse or do not get better with treatment and home care. Summary Morton neuralgia is foot pain that affects the ball of the foot and the area near the toes. Pain usually occurs between the third and fourth toes, gets worse with walking, and goes away at night. Morton neuralgia occurs when part of a nerve in the foot (digital nerve) is under too much pressure. When this happens over a long period of time, the nerve can thicken (neuroma) and cause pain. This condition is caused by doing the same things over and over with your foot, such as running or jumping, wearing shoes that are too tight, or wearing high heels. Treatment may involve wearing low-heeled shoes that are not too tight, wearing  a supportive pad (orthotic) under the front of your foot, getting injections in the nerve, or having surgery to remove part of the thickened nerve. This information is not intended to replace advice given to you by your health care provider. Make sure you discuss any questions you have with your health care provider. Document Revised: 05/29/2020 Document Reviewed: 05/29/2020 Elsevier Patient Education  [place].

## 2021-10-14 NOTE — Progress Notes (Signed)
Subjective: Chief Complaint  Patient presents with   Foot Pain    Patient came in today for left foot, Neuroma, forefoot, shooting pain in toes, in the morning feels like there is a rock in her shoe, rate of pain 10 out 10, patients states she is doing worse than the last visit, TX: orthotics, injection     51 year old female presents here for concerns.  Pain in the morning then 2 hours into work. She gets sharp pain intot he 2nd and the 3rd toes.  Last injection was helpful.  No recent injury or drainage otherwise.   Objective: AAO x3, NAD DP/PT pulses palpable bilaterally, CRT less than 3 seconds On the left foot there is tenderness on the second interspace no palpable neuroma is noted.  There is no area pinpoint tenderness.  No pain to the MPJs.  Localized edema along the interspace.  MMT 5/5. No pain with calf compression, swelling, warmth, erythema  Assessment: Left second interspace neuroma  Plan: -All treatment options discussed with the patient including all alternatives, risks, complications.  -Discussed surgical as well as conservative care. -Injection performed of the second interspace.  Skin was cleaned with alcohol mixture 1 cc Kenalog 10, 0.5 cc of Marcaine plain, 0.5 cc of lidocaine plain was infiltrated into the interspace without complications along the area of tenderness. -Added a neuroma pad -Discussed other conservative options including sclerosing alcohol injections, physical therapy. -Patient encouraged to call the office with any questions, concerns, change in symptoms.   Trula Slade DPM

## 2021-10-18 ENCOUNTER — Ambulatory Visit: Payer: Managed Care, Other (non HMO) | Admitting: Podiatry

## 2021-10-18 ENCOUNTER — Ambulatory Visit: Payer: Managed Care, Other (non HMO)

## 2021-11-16 ENCOUNTER — Ambulatory Visit: Payer: Managed Care, Other (non HMO) | Admitting: Physician Assistant

## 2021-11-21 NOTE — Progress Notes (Addendum)
Cardiology Office Note   Date:  11/22/2021   ID:  Cynthia Nguyen, DOB 05-May-1970, MRN [id number]  PCP:  London Pepper, MD  Cardiologist:   Minus Breeding, MD Referring:  Self   Chief Complaint  Patient presents with   Palpitations      History of Present Illness: Cynthia Nguyen is a 51 y.o. female who presents for evaluation of palpitations.     She had tachycardia at age 25 treated with beta blockers.  She came off of these in her mid 41s.  She was having more palpitations in 2015 and had a Holter with no significant arrhythmias.  She had a negative stress test in 2017.  Coronary calcium was zero.  Echo was normal.     She has had continued palpitations.  We tried to change her atenolol to 25 mg bid.  However, she felt anxious, nauseated and dizzy.  BP was low so we suggested reducing back down to 25 mg PO daily.    Since I last saw her she has had increasing palpitations.  These are happening every day throughout the day.  She does not notice them as much if she is busy and at work.  She notices in the evening.  She will notice some when she is going to sleep.  She describes skipping beats rather than sustained rapid rates.  She has not had any presyncope or syncope.  She was swimming this summer and could not bring them on.  She is lifting weights now and not bringing these on.  She is not describing chest pressure, neck or arm discomfort.  She has had no weight gain or edema.   Past Medical History:  Diagnosis Date   Fibrocystic breast disease    Generalized anxiety disorder    Mitral valve prolapse    childhood   Scoliosis    per pt >60% curvature   Seasonal allergies     Past Surgical History:  Procedure Laterality Date   BREAST CYST ASPIRATION  08/16/2010   BREAST CYST ASPIRATION  12/25/2009   BREAST CYST ASPIRATION  07/22/2009   WISDOM TOOTH EXTRACTION       Current Outpatient Medications  Medication Sig Dispense Refill   atenolol (TENORMIN) 25 MG tablet Take  1 tablet (25 mg total) by mouth 2 (two) times daily. 180 tablet 3   Biotin w/ Vitamins C & E (HAIR/SKIN/NAILS PO) Take by mouth daily.     calcium carbonate (OS-CAL) 600 MG TABS tablet Take 600 mg by mouth 2 (two) times daily with a meal.      Cholecalciferol (VITAMIN D3) 50 MCG (2000 UT) TABS Take 1 tablet by mouth daily.     diltiazem (CARDIZEM CD) 120 MG 24 hr capsule Take 1 capsule by mouth once daily 180 capsule 0   estradiol (VIVELLE-DOT) 0.1 MG/24HR patch Place 1 patch onto the skin 2 (two) times a week.     MELATONIN PO Take 3 mg by mouth at bedtime.     progesterone (PROMETRIUM) 100 MG capsule Take 1 capsule by mouth daily.     No current facility-administered medications for this visit.    Allergies:   Cephalexin    ROS:  Please see the history of present illness.   Otherwise, review of systems are positive for none.   All other systems are reviewed and negative.    PHYSICAL EXAM: VS:  BP 99/67   Pulse (!) 55   Ht '5\' 4"'$  (1.626 m)   Wt  136 lb (61.7 kg)   SpO2 97%   BMI 23.34 kg/m  , BMI Body mass index is 23.34 kg/m. GENERAL:  Well appearing NECK:  No jugular venous distention, waveform within normal limits, carotid upstroke brisk and symmetric, no bruits, no thyromegaly LUNGS:  Clear to auscultation bilaterally CHEST:  Unremarkable HEART:  PMI not displaced or sustained,S1 and S2 within normal limits, no S3, no S4, no clicks, no rubs, no murmurs ABD:  Flat, positive bowel sounds normal in frequency in pitch, no bruits, no rebound, no guarding, no midline pulsatile mass, no hepatomegaly, no splenomegaly EXT:  2 plus pulses throughout, no edema, no cyanosis no clubbing  EKG:  EKG is  ordered today. Normal sinus rhythm, rate 54, axis within normal limits, low voltage in the limb and chest leads, poor anterior R wave progression, no change from previous.  Recent Labs: No results found for requested labs within last 365 days.    Lipid Panel No results found for:  "CHOL", "TRIG", "HDL", "CHOLHDL", "VLDL", "LDLCALC", "LDLDIRECT"    Wt Readings from Last 3 Encounters:  11/22/21 136 lb (61.7 kg)  04/06/21 143 lb (64.9 kg)  11/16/20 149 lb 9.6 oz (67.9 kg)      Other studies Reviewed: Additional studies/ records that were reviewed today include:  Labs Review of the above records demonstrates:  Please see elsewhere in the note.     ASSESSMENT AND PLAN:  PALPITATIONS:    These seem to be more intense.  They are more frequent.  She will get a 3-day monitor.  I did review her labs and her TSH and electrolytes were unremarkable recently.  Once I see the results of her monitor, I will likely need to change her current regimen as it does not seem to be controlling her symptoms any longer.  Current medicines are reviewed at length with the patient today.  The patient does not have concerns regarding medicines.  The following changes have been made: None  Labs/ tests ordered today include:    Orders Placed This Encounter  Procedures   LONG TERM MONITOR (3-14 DAYS)   EKG 12-Lead      Disposition:   Follow up in 3 months.   Signed, Minus Breeding, MD  11/22/2021 10:59 AM    [place]

## 2021-11-22 ENCOUNTER — Ambulatory Visit
Admission: RE | Admit: 2021-11-22 | Discharge: 2021-11-22 | Disposition: A | Discharge status: Home / Self Care | Payer: Managed Care, Other (non HMO) | Source: Ambulatory Visit | Attending: Family Medicine | Admitting: Family Medicine

## 2021-11-22 ENCOUNTER — Ambulatory Visit: Payer: Managed Care, Other (non HMO) | Attending: Cardiology | Admitting: Cardiology

## 2021-11-22 ENCOUNTER — Encounter: Payer: Self-pay | Admitting: Cardiology

## 2021-11-22 ENCOUNTER — Ambulatory Visit: Payer: Managed Care, Other (non HMO) | Admitting: Cardiology

## 2021-11-22 ENCOUNTER — Ambulatory Visit: Payer: Managed Care, Other (non HMO) | Attending: Cardiology

## 2021-11-22 VITALS — BP 99/67 | HR 55 | Ht 64.0 in | Wt 136.0 lb

## 2021-11-22 DIAGNOSIS — R002 Palpitations: Secondary | ICD-10-CM

## 2021-11-22 DIAGNOSIS — Z1231 Encounter for screening mammogram for malignant neoplasm of breast: Secondary | ICD-10-CM

## 2021-11-22 NOTE — Patient Instructions (Signed)
Medication Instructions:  Your physician recommends that you continue on your current medications as directed. Please refer to the Current Medication list given to you today.  *If you need a refill on your cardiac medications before your next appointment, please call your pharmacy*  Testing/Procedures: Elaine Monitor Instructions   Your physician has requested you wear a ZIO patch monitor for _3_ days.  This is a single patch monitor.   [employer] supplies one patch monitor per enrollment. Additional stickers are not available. Please do not apply patch if you will be having a Nuclear Stress Test, Echocardiogram, Cardiac CT, MRI, or Chest Xray during the period you would be wearing the monitor. The patch cannot be worn during these tests. You cannot remove and re-apply the ZIO XT patch monitor.  Your ZIO patch monitor will be sent Fed Ex from [employer] directly to your home address. It may take 3-5 days to receive your monitor after you have been enrolled.  Once you have received your monitor, please review the enclosed instructions. Your monitor has already been registered assigning a specific monitor serial # to you.  Billing and Patient Assistance Program Information   We have supplied [employer] with any of your insurance information on file for billing purposes. [employer] offers a sliding scale Patient Assistance Program for patients that do not have insurance, or whose insurance does not completely cover the cost of the ZIO monitor.   You must apply for the Patient Assistance Program to qualify for this discounted rate.     To apply, please call [employer] at [phone], select option 4, then select option 2, and ask to apply for Patient Assistance Program.  Theodore Demark will ask your household income, and how many people are in your household.  They will quote your out-of-pocket cost based on that information.  [employer] will also be able to set up a 55-month interest-free payment plan  if needed.  Applying the monitor   Shave hair from upper left chest.  Hold abrader disc by orange tab. Rub abrader in 40 strokes over the upper left chest as indicated in your monitor instructions.  Clean area with 4 enclosed alcohol pads. Let dry.  Apply patch as indicated in monitor instructions. Patch will be placed under collarbone on left side of chest with arrow pointing upward.  Rub patch adhesive wings for 2 minutes. Remove white label marked "1". Remove the white label marked "2". Rub patch adhesive wings for 2 additional minutes.  While looking in a mirror, press and release button in center of patch. A small green light will flash 3-4 times. This will be your only indicator that the monitor has been turned on. ?  Do not shower for the first 24 hours. You may shower after the first 24 hours.  Press the button if you feel a symptom. You will hear a small click. Record Date, Time and Symptom in the Patient Logbook.  When you are ready to remove the patch, follow instructions on the last 2 pages of the Patient Logbook. Stick patch monitor onto the last page of Patient Logbook.  Place Patient Logbook in the blue and white box.  Use locking tab on box and tape box closed securely.  The blue and white box has prepaid postage on it. Please place it in the mailbox as soon as possible. Your physician should have your test results approximately 7 days after [facility]  Call [facility]  at [phone] if you have questions regarding your ZIO XT patch monitor. Call them immediately if you see an orange light blinking on your monitor.  If your monitor falls off in less than 4 days, contact our Monitor department at [phone]. ?If your monitor becomes loose or falls off after 4 days call [employer] at [phone] for suggestions on securing your monitor.?  Follow-Up: At [facility], you and your health needs are our priority.  As  part of our continuing mission to provide you with exceptional heart care, we have created designated Provider Care Teams.  These Care Teams include your primary Cardiologist (physician) and Advanced Practice Providers (APPs -  Physician Assistants and Nurse Practitioners) who all work together to provide you with the care you need, when you need it.  We recommend signing up for the patient portal called "MyChart".  Sign up information is provided on this After Visit Summary.  MyChart is used to connect with patients for Virtual Visits (Telemedicine).  Patients are able to view lab/test results, encounter notes, upcoming appointments, etc.  Non-urgent messages can be sent to your provider as well.   To learn more about what you can do with MyChart, go to [email or web address].    Your next appointment:   3 months with Dr. Percival Spanish

## 2021-11-22 NOTE — Progress Notes (Unsigned)
Enrolled for [employer] to mail a ZIO XT long term holter monitor to the patients address on file.  

## 2021-11-28 DIAGNOSIS — R002 Palpitations: Secondary | ICD-10-CM | POA: Diagnosis not present

## 2021-12-14 ENCOUNTER — Encounter: Payer: Self-pay | Admitting: Cardiology

## 2021-12-14 MED ORDER — METOPROLOL TARTRATE 25 MG PO TABS: 25.0000 mg | ORAL_TABLET | Freq: Two times a day (BID) | ORAL | 3 refills | Status: DC

## 2021-12-14 NOTE — Telephone Encounter (Signed)
Per result note:  Minus Breeding, MD 12/12/2021 12:36 PM EST     There were no significant arrhythmias.  Rare ventricular ectopy.  However, she has been having symptoms and I would be happy to talk with her about trying new meds to see if it helps symptomatically.  We could try a different beta blocker like metoprolol.  I see that she was on propranolol but I don't see metoprolol having been tried.   If she would like to I would start with metoprolol 25 mg tartrate bid and stop the atenolol.  Continue Cardizem. Call Ms. Prospero with the results and send results to London Pepper, MD

## 2021-12-16 ENCOUNTER — Ambulatory Visit: Payer: Managed Care, Other (non HMO) | Admitting: Podiatry

## 2021-12-16 DIAGNOSIS — B351 Tinea unguium: Secondary | ICD-10-CM | POA: Diagnosis not present

## 2021-12-16 DIAGNOSIS — D3610 Benign neoplasm of peripheral nerves and autonomic nervous system, unspecified: Secondary | ICD-10-CM

## 2021-12-16 MED ORDER — TRIAMCINOLONE ACETONIDE 10 MG/ML IJ SUSP
10.0000 mg | Freq: Once | INTRAMUSCULAR | Status: AC
  Administered 2021-12-16: 10 mg

## 2021-12-16 NOTE — Patient Instructions (Signed)

## 2021-12-16 NOTE — Progress Notes (Signed)
Subjective: Chief Complaint  Patient presents with   Neuroma    Left foot Neuroma, patient states she is doing much better, rate of pain 2 out of 10,  having tendon pain TX: Injection (has helped)    51 year old female presents the above concerns.  She states that she is doing much better and she is able to walk more and not having significant pain.  Nails are also thickened discolored.  No recent injuries or changes otherwise.  Objective: AAO x3, NAD DP/PT pulses palpable bilaterally, CRT less than 3 seconds Nails are mildly hypertrophic, dystrophic with yellow, brown discoloration.  No pain in the nails. There is still mild tenderness palpation on the second interspace and there is a palpable neuroma noted.  There is no area pinpoint tenderness.  No edema, erythema.  MMT 5/5. 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  -Steroid injection was performed.  See procedure note below.  Continue offloading pads.  If symptoms continue discussed other treatment options including sclerosing alcohol injection, physical therapy or excision. -Sharply debrided the nails and sent this for culture, pathology to [facility].  -Patient encouraged to call the office with any questions, concerns, change in symptoms.   Procedure: Injection neuroma Discussed alternatives, risks, complications and verbal consent was obtained.  Location: Left second interspace neuroma Skin Prep: Alcohol. Injectate: 0.5cc 0.5% marcaine plain, 0.5 cc 2% lidocaine plain and, 1 cc kenalog 10. Disposition: Patient tolerated procedure well. Injection site dressed with a band-aid.  Post-injection care was discussed and return precautions discussed.   Return in about 6 weeks (around 01/27/2022).  Trula Slade DPM

## 2021-12-22 MED ORDER — ATENOLOL 25 MG PO TABS: 25.0000 mg | ORAL_TABLET | Freq: Two times a day (BID) | ORAL | 3 refills | Status: DC

## 2021-12-22 MED ORDER — ATENOLOL 50 MG PO TABS: 50.0000 mg | ORAL_TABLET | Freq: Every day | ORAL | 3 refills | Status: DC

## 2021-12-22 NOTE — Addendum Note (Signed)
Addended by: Caprice Beaver T on: 12/22/2021 10:43 AM   Modules accepted: Orders

## 2021-12-22 NOTE — Addendum Note (Signed)
Addended by: Cristopher Estimable on: 12/22/2021 04:46 PM   Modules accepted: Orders

## 2022-01-10 DIAGNOSIS — N898 Other specified noninflammatory disorders of vagina: Secondary | ICD-10-CM | POA: Diagnosis not present

## 2022-01-10 DIAGNOSIS — N926 Irregular menstruation, unspecified: Secondary | ICD-10-CM | POA: Diagnosis not present

## 2022-01-18 DIAGNOSIS — M419 Scoliosis, unspecified: Secondary | ICD-10-CM | POA: Diagnosis not present

## 2022-01-18 DIAGNOSIS — Z13220 Encounter for screening for lipoid disorders: Secondary | ICD-10-CM | POA: Diagnosis not present

## 2022-01-18 DIAGNOSIS — Z0000 Encounter for general adult medical examination without abnormal findings: Secondary | ICD-10-CM | POA: Diagnosis not present

## 2022-01-18 DIAGNOSIS — E559 Vitamin D deficiency, unspecified: Secondary | ICD-10-CM | POA: Diagnosis not present

## 2022-01-18 DIAGNOSIS — Z113 Encounter for screening for infections with a predominantly sexual mode of transmission: Secondary | ICD-10-CM | POA: Diagnosis not present

## 2022-01-18 DIAGNOSIS — R7309 Other abnormal glucose: Secondary | ICD-10-CM | POA: Diagnosis not present

## 2022-01-24 DIAGNOSIS — N841 Polyp of cervix uteri: Secondary | ICD-10-CM | POA: Diagnosis not present

## 2022-01-24 DIAGNOSIS — N950 Postmenopausal bleeding: Secondary | ICD-10-CM | POA: Diagnosis not present

## 2022-01-24 DIAGNOSIS — N898 Other specified noninflammatory disorders of vagina: Secondary | ICD-10-CM | POA: Diagnosis not present

## 2022-01-27 ENCOUNTER — Ambulatory Visit: Payer: Managed Care, Other (non HMO) | Admitting: Podiatry

## 2022-01-31 ENCOUNTER — Ambulatory Visit: Payer: Managed Care, Other (non HMO) | Admitting: Podiatry

## 2022-01-31 DIAGNOSIS — L603 Nail dystrophy: Secondary | ICD-10-CM

## 2022-01-31 DIAGNOSIS — D3610 Benign neoplasm of peripheral nerves and autonomic nervous system, unspecified: Secondary | ICD-10-CM | POA: Diagnosis not present

## 2022-01-31 NOTE — Progress Notes (Signed)
Subjective: Chief Complaint  Patient presents with   Follow-up    Neuroma, left foot, has become better per patient, still becomees tender, supports help    52 year old female presents for above concerns.  She states that it is good as long as she wears a shoe. At times for no reason she will get numbness to the 2nd and 3rd toes. It is about 85-90% better.   Objective: AAO x3, NAD DP/PT pulses palpable bilaterally, CRT less than 3 seconds She still gets tenderness palpation of the second interspace of the left foot but there is no sign of edema with no erythema.  Flexor, extensor tendons appear to be intact.  There is no area of pinpoint tenderness.  Subjectively she states that her symptoms are improved. No pain with calf compression, swelling, warmth, erythema  Assessment: Neuroma, onychodystrophy  Plan: -All treatment options discussed with the patient including all alternatives, risks, complications.  -We discussed another injection today, she is doing better she will hold off on this.  We discussed continue shoes, good arch support as well as offloading to help.  The symptoms persist or start to worsen with further injection or consider other types of injections including dehydrated alcohol. -Urea nail gel for the nail dystrophy -Patient encouraged to call the office with any questions, concerns, change in symptoms.   Trula Slade DPM

## 2022-02-27 ENCOUNTER — Other Ambulatory Visit: Payer: Self-pay | Admitting: Cardiology

## 2022-02-27 NOTE — Progress Notes (Unsigned)
Cardiology Office Note   Date:  02/28/2022   ID:  Cynthia Nguyen, DOB 19-Mar-1970, MRN [id number]  PCP:  London Pepper, MD  Cardiologist:   Minus Breeding, MD Referring:  Self   Chief Complaint  Patient presents with   Palpitations      History of Present Illness: Cynthia Nguyen is a 52 y.o. female who presents for evaluation of palpitations.     She had tachycardia at age 16 treated with beta blockers.  She came off of these in her mid 75s.  She was having more palpitations in 2015 and had a Holter with no significant arrhythmias.  She had a negative stress test in 2017.  Coronary calcium was zero.  Echo was normal.     She has had continued palpitations.  We tried to change her atenolol to 25 mg bid.  However, she felt anxious, nauseated and dizzy.  BP was low so we suggested reducing back down to 25 mg PO daily.    Since I last saw her she continues to have palpitations.  She does a little bit better when she takes her atenolol 50 mg once daily.  She is still having some trouble sleeping.  She can fall asleep but then wakes up early.  She denies any chest pressure, neck or arm discomfort.  She has had no presyncope or syncope.   Past Medical History:  Diagnosis Date   Fibrocystic breast disease    Generalized anxiety disorder    Mitral valve prolapse    childhood   Scoliosis    per pt >60% curvature   Seasonal allergies     Past Surgical History:  Procedure Laterality Date   BREAST CYST ASPIRATION  08/16/2010   BREAST CYST ASPIRATION  12/25/2009   BREAST CYST ASPIRATION  07/22/2009   WISDOM TOOTH EXTRACTION       Current Outpatient Medications  Medication Sig Dispense Refill   Biotin w/ Vitamins C & E (HAIR/SKIN/NAILS PO) Take by mouth daily.     calcium carbonate (OS-CAL) 600 MG TABS tablet Take 600 mg by mouth 2 (two) times daily with a meal.      Cholecalciferol (VITAMIN D3) 50 MCG (2000 UT) TABS Take 1 tablet by mouth daily.     diltiazem (CARDIZEM CD) 120  MG 24 hr capsule TAKE 1 CAPSULE BY MOUTH ONCE DAILY . APPOINTMENT REQUIRED FOR FUTURE REFILLS 180 capsule 3   estradiol (VIVELLE-DOT) 0.1 MG/24HR patch Place 1 patch onto the skin 2 (two) times a week.     MELATONIN PO Take 3 mg by mouth at bedtime.     NON FORMULARY [place] nail gel- Apply once daily nightly     progesterone (PROMETRIUM) 100 MG capsule Take 1 capsule by mouth daily. Taking 200 mg     TENORMIN 50 MG tablet Take 1 tablet (50 mg total) by mouth daily. 90 tablet 3   No current facility-administered medications for this visit.    Allergies:   Cephalexin    ROS:  Please see the history of present illness.   Otherwise, review of systems are positive for none.   All other systems are reviewed and negative.    PHYSICAL EXAM: VS:  BP 104/64   Pulse (!) 54   Ht '5\' 4"'$  (1.626 m)   Wt 138 lb (62.6 kg)   SpO2 94%   BMI 23.69 kg/m  , BMI Body mass index is 23.69 kg/m. GENERAL:  Well appearing NECK:  No jugular  venous distention, waveform within normal limits, carotid upstroke brisk and symmetric, no bruits, no thyromegaly LUNGS:  Clear to auscultation bilaterally CHEST:  Unremarkable HEART:  PMI not displaced or sustained,S1 and S2 within normal limits, no S3, no S4, no clicks, no rubs, no murmurs ABD:  Flat, positive bowel sounds normal in frequency in pitch, no bruits, no rebound, no guarding, no midline pulsatile mass, no hepatomegaly, no splenomegaly EXT:  2 plus pulses throughout, no edema, no cyanosis no clubbing  EKG:  EKG is not ordered today.     Recent Labs: No results found for requested labs within last 365 days.    Lipid Panel No results found for: "CHOL", "TRIG", "HDL", "CHOLHDL", "VLDL", "LDLCALC", "LDLDIRECT"    Wt Readings from Last 3 Encounters:  02/28/22 138 lb (62.6 kg)  11/22/21 136 lb (61.7 kg)  04/06/21 143 lb (64.9 kg)      Other studies Reviewed: Additional studies/ records that were reviewed today include:  None Review of the above  records demonstrates:  Please see elsewhere in the note.     ASSESSMENT AND PLAN:  PALPITATIONS:    We are going to try to use his branded Tenormin and she is also given instructions to take an extra 25 if she is having a particularly bad day as long as she is not lightheaded with bradycardia or hypotension.   SLEEP:  She is going to restart trazodone which was suggested by her sleep consultant.   Current medicines are reviewed at length with the patient today.  The patient does not have concerns regarding medicines.  The following changes have been made: None  Labs/ tests ordered today include:  None  No orders of the defined types were placed in this encounter.    Disposition:   Follow up in 12 months.   Signed, Minus Breeding, MD  02/28/2022 1:05 PM    [place]

## 2022-02-28 ENCOUNTER — Ambulatory Visit: Payer: Managed Care, Other (non HMO) | Attending: Cardiology | Admitting: Cardiology

## 2022-02-28 ENCOUNTER — Encounter: Payer: Self-pay | Admitting: Cardiology

## 2022-02-28 VITALS — BP 104/64 | HR 54 | Ht 64.0 in | Wt 138.0 lb

## 2022-02-28 DIAGNOSIS — R002 Palpitations: Secondary | ICD-10-CM

## 2022-02-28 MED ORDER — TENORMIN 50 MG PO TABS: 50.0000 mg | ORAL_TABLET | Freq: Every day | ORAL | 3 refills | Status: DC

## 2022-02-28 NOTE — Patient Instructions (Signed)
Medication Instructions:   TENORMIN SCRIPT SENT TO [place]  *If you need a refill on your cardiac medications before your next appointment, please call your pharmacy*   Follow-Up: At [facility], you and your health needs are our priority.  As part of our continuing mission to provide you with exceptional heart care, we have created designated Provider Care Teams.  These Care Teams include your primary Cardiologist (physician) and Advanced Practice Providers (APPs -  Physician Assistants and Nurse Practitioners) who all work together to provide you with the care you need, when you need it.  We recommend signing up for the patient portal called "MyChart".  Sign up information is provided on this After Visit Summary.  MyChart is used to connect with patients for Virtual Visits (Telemedicine).  Patients are able to view lab/test results, encounter notes, upcoming appointments, etc.  Non-urgent messages can be sent to your provider as well.   To learn more about what you can do with MyChart, go to [email or web address].    Your next appointment:   6 month(s)  Provider:   Minus Breeding, MD

## 2022-03-23 DIAGNOSIS — U071 COVID-19: Secondary | ICD-10-CM | POA: Diagnosis not present

## 2022-03-23 DIAGNOSIS — R509 Fever, unspecified: Secondary | ICD-10-CM | POA: Diagnosis not present

## 2022-03-27 DIAGNOSIS — M41129 Adolescent idiopathic scoliosis, site unspecified: Secondary | ICD-10-CM | POA: Diagnosis not present

## 2022-03-27 DIAGNOSIS — M545 Low back pain, unspecified: Secondary | ICD-10-CM | POA: Diagnosis not present

## 2022-03-27 DIAGNOSIS — M546 Pain in thoracic spine: Secondary | ICD-10-CM | POA: Diagnosis not present

## 2022-04-18 DIAGNOSIS — R253 Fasciculation: Secondary | ICD-10-CM | POA: Diagnosis not present

## 2022-04-18 DIAGNOSIS — B001 Herpesviral vesicular dermatitis: Secondary | ICD-10-CM | POA: Diagnosis not present

## 2022-05-03 ENCOUNTER — Ambulatory Visit: Payer: BC Managed Care – PPO | Admitting: Neurology

## 2022-05-03 ENCOUNTER — Encounter: Payer: Self-pay | Admitting: Neurology

## 2022-05-03 VITALS — BP 112/68 | HR 54 | Ht 64.0 in | Wt 138.8 lb

## 2022-05-03 DIAGNOSIS — R002 Palpitations: Secondary | ICD-10-CM | POA: Diagnosis not present

## 2022-05-03 DIAGNOSIS — R253 Fasciculation: Secondary | ICD-10-CM | POA: Insufficient documentation

## 2022-05-03 NOTE — Progress Notes (Signed)
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 female patient who is here for revisit 05/03/2022 for  Twitching, new problem , established patient.  Chief concern according to patient :    Patient in room #2 and alone. Patient states she here today to discuss muscle twitching all over her body and LE spasms. Patient states her hands and foot felt cold with pins and needles pain, foot started twitching. she has pain in her head, ear and sinus pressure  on the left side.   The patient reports she has better sleep , restful sleep, able to exercise, but a COVID infection was treated by Paxlovid on day 5 of a COVID infection-  In March she flew from [facility] home to [place] and felt as if she had a mild cold, felt fatigued, had diarrhea, low febrile- PCP confirmed a COVID infection and this was treated by Paxlovid 03-23-2022 on day 5 of a COVID infection- and she noted onset of twitching, pulsations and palpitations. Her body feels buzzing.  She called back and reported cold sores on 04-18-2022, and she was given Valtrex. The Twitching continued after she d/c B Valtrex. 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 She sleeps 6 hours at night and will take a power nap.  Epworth score at 12/ 24  points. FSS at 13 points. Refill Trazodone.      10-28-2019 Dr. 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   This is a possible explanation for spontaneous arousals especially those that are preceded by other vivid dreams / abnormal dreams.  We see also more REM sleep related arousals in patients with anxiety or depression. Some medications will help to suppress the proportion of REM sleep however that is not necessarily meaning that the patient sleeps better or more restorative.  Her medications as listed in her initial consultation have slightly changed she is taking propanolol, Flonase as needed she is also on 0.05 mg estradiol patch the so-called to be well developed.  In addition this progesterone 50 mg.  She is currently weaning off Estrogen /Progesterone and is down to 1/4 of her previous dose- has no menopausal symptoms at this point, feels well. She is finally acknowledging same anxiety to be present.           08-12-2019. Originally sent  from Dr Antoine Poche  for a sleep work up.   Chief concern  according to patient : " I have palpitations all day long. When I get enough sleep the palpitations are less noticeable. I take beta blockers and melatonin to help fall asleep".  "I now have to avoid all caffeine and ETOH". I have the pleasure of seeing Cynthia Nguyen today, a right -handed Other or two or more races female with a possible sleep disorder. She  has a past medical history of Fibrocystic breast disease, Generalized anxiety disorder, Mitral valve prolapse, Scoliosis, and Seasonal allergies.  she has scoliosis, no surgical treatment.  Sleep relevant medical history: palpitations, insomnia. Averaging 5.5 hours of sleep per fit bit. No Tonsillectomy but large tonsils- snoring .  A deviated septum was found by Dr Annalee Genta.    Family medical /sleep history:  Mother with insomnia, and RLS.  her insomnia started after her husband passed away.  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 Living with mother and partner. The patient currently works in her own office. Pets are present, 2 small dogs. .    Review of Systems: Out of a complete 14 system review, the patient complains of only the following symptoms, and all other reviewed systems are negative.:   Social History   Socioeconomic History   Marital status: Single    Spouse name: Not on file   Number of children: Not on file   Years of education: Not on file   Highest education level: Not on file  Occupational History   Occupation: Dentist    Employer: [employer]  Tobacco Use   Smoking status: Never   Smokeless tobacco: Never  Substance and Sexual Activity   Alcohol use: Yes    Alcohol/week: 2.0 standard drinks of alcohol    Types: 2 Standard drinks or equivalent per week    Comment: occ   Drug use: No   Sexual activity: Not on file  Other Topics Concern   Not on file  Social History Narrative   Not on file   Social Determinants of Health   Financial Resource Strain: Not on file  Food Insecurity: Not on file  Transportation Needs: Not on file  Physical Activity: Not on file  Stress: Not on file  Social Connections: Not on file    Family History  Problem Relation Age of Onset   Colon polyps Mother    Hypertension Mother    Insomnia Mother    Atrial fibrillation Father    Diabetes Father    Colon cancer Father    Insomnia Father    Emphysema Paternal Uncle    Stomach cancer Neg Hx    Rectal cancer Neg Hx     Past Medical History:  Diagnosis Date   Fibrocystic breast disease    Generalized anxiety disorder    Mitral valve prolapse    childhood   Scoliosis    per pt >60%  curvature   Seasonal allergies     Past Surgical History:  Procedure Laterality Date   BREAST CYST ASPIRATION  08/16/2010   BREAST CYST ASPIRATION  12/25/2009   BREAST CYST ASPIRATION  07/22/2009   WISDOM TOOTH EXTRACTION       Current Outpatient Medications on File Prior to Visit  Medication Sig Dispense Refill   Biotin w/ Vitamins C & E (HAIR/SKIN/NAILS PO) Take by mouth daily.     calcium carbonate (OS-CAL) 600 MG TABS tablet Take 600 mg by mouth  2 (two) times daily with a meal.      Cholecalciferol (VITAMIN D3) 50 MCG (2000 UT) TABS Take 1 tablet by mouth daily.     diltiazem (CARDIZEM CD) 120 MG 24 hr capsule TAKE 1 CAPSULE BY MOUTH ONCE DAILY . APPOINTMENT REQUIRED FOR FUTURE REFILLS 180 capsule 3   estradiol (VIVELLE-DOT) 0.1 MG/24HR patch Place 1 patch onto the skin 2 (two) times a week.     Magnesium 250 MG TABS Take 250 mg by mouth daily.     MELATONIN PO Take 3 mg by mouth at bedtime.     NON FORMULARY Uria Forty nail gel- Apply once daily nightly     progesterone (PROMETRIUM) 100 MG capsule Take 1 capsule by mouth daily. Taking 200 mg     TENORMIN 50 MG tablet Take 1 tablet (50 mg total) by mouth daily. 90 tablet 3   No current facility-administered medications on file prior to visit.    Allergies  Allergen Reactions   Cephalexin     dizziness     DIAGNOSTIC DATA (LABS, IMAGING, TESTING) - I reviewed patient records, labs, notes, testing and imaging myself where available.  Lab Results  Component Value Date   WBC 5.7 06/08/2018   HGB 14.7 06/08/2018   HCT 42.8 06/08/2018   MCV 90 06/08/2018   PLT 254 06/08/2018   I have reviewed all the lab results.   Magnesium 1.9  Normal CBC and normal TSH   CMET was normal.  The differential included high basilar field count of 1.5% elevated lymphocytes and 45.5% and reduced neutrophils at 40.2%.  Neutrophiles I usually reduced in a viral infection.  Lymphocytes are increased in the viral infection and she just had  overcome COVID. 04-19-2022   Lab Results  Component Value Date   TSH 1.140 06/08/2018    PHYSICAL EXAM:  Today's Vitals   05/03/22 1535  BP: 112/68  Pulse: (!) 54  Weight: 138 lb 12.8 oz (63 kg)  Height: 5\' 4"  (1.626 m)   Body mass index is 23.82 kg/m.   Wt Readings from Last 3 Encounters:  05/03/22 138 lb 12.8 oz (63 kg)  02/28/22 138 lb (62.6 kg)  11/22/21 136 lb (61.7 kg)     Ht Readings from Last 3 Encounters:  05/03/22 5\' 4"  (1.626 m)  02/28/22 5\' 4"  (1.626 m)  11/22/21 5\' 4"  (1.626 m)      General: The patient is awake, alert and appears not in acute distress. The patient is well groomed. Head: Normocephalic, atraumatic. Cardiovascular:  Regular rate and cardiac rhythm by pulse,  without distended neck veins. Respiratory: Lungs are clear to auscultation.  Skin:  Without evidence of ankle edema, or rash. Trunk: The patient's posture is erect.   NEUROLOGIC EXAM: The patient is awake and alert, oriented to place and time.   Memory subjective described as intact.  Attention span & concentration ability appears normal.  Speech is fluent,  without  dysarthria, dysphonia or aphasia.  Mood and affect are appropriate.   Cranial nerves: no loss of smell or taste reported  Pupils are equal and briskly reactive to light. Extraocular movements in vertical and horizontal planes were intact and without nystagmus. No Diplopia. Visual fields by finger perimetry are intact. Hearing was intact to soft voice and finger rubbing.    Facial sensation intact to fine touch.  Facial motor strength is symmetric and tongue and uvula move midline.  Neck ROM : rotation, tilt and flexion extension were normal for  age and shoulder shrug was symmetrical.    Motor exam:  Symmetric bulk, tone and ROM.   Normal tone without cog wheeling, symmetric grip strength .   Sensory:  intact .    Coordination: Rapid alternating movements in the fingers/hands were of normal speed.  The  Finger-to-nose maneuver was intact without evidence of ataxia, dysmetria or tremor.      ASSESSMENT AND PLAN 52 y.o. year old female  here with:    1) fasciculations , seen today on feet and right lower extremity.  Abnormal twitching , fluttering sensation. A'  ripple "this exacerbated within a week of Covid infection. Low neutrophils and high lymphocytes post Pavlovyx,  2) Plan NCV / EMG to help differentiate from benign fasciculations.   I plan to follow up either personally or through our NP within 6 months.   I would like to thank Farris Has, MD and Farris Has, Md [address] [place],  [place] [address] for allowing me to meet with and to take care of this pleasant patient.   CC: I will share my notes with Dr Antoine Poche .  After spending a total time of  24  minutes face to face and additional time for physical and neurologic examination, review of laboratory studies,  personal review of imaging studies, reports and results of other testing and review of referral information / records as far as provided in visit,   Electronically signed by: Melvyn Novas, MD 05/03/2022 3:57 PM  [facility] and [facility] Sleep Board certified by The [employer] of Sleep Medicine and Diplomate of the [employer] of Sleep Medicine. Board certified In Neurology through the ABPN, Fellow of the [employer] of Neurology. Medical Director of [facility]

## 2022-05-03 NOTE — Patient Instructions (Signed)
Electromyoneurogram Electromyoneurogram is a test to check how well your muscles and nerves are working. This procedure includes the combined use of electromyogram (EMG) and nerve conduction study (NCS). EMG is used to evaluate muscles and the nerves that control those muscles. NCS, which is also called electroneurogram, measures how well your nerves conduct electricity. The procedures should be done together to check if your muscles and nerves are healthy. If the results of the tests are abnormal, this may indicate disease or injury, such as a neuromuscular disease or peripheral nerve damage. Tell a health care provider about: Any allergies you have. All medicines you are taking, including vitamins, herbs, eye drops, creams, and over-the-counter medicines. Any bleeding problems you have. Any surgeries you have had. Any medical conditions you have. What are the risks? Generally, this is a safe procedure. However, problems may occur, including: Bleeding or bruising. Infection where the electrodes were inserted. What happens before the test? Medicines Take all of your usually prescribed medications before this testing is performed. Do not stop your blood thinners unless advised by your prescribing physician. General instructions Your health care provider may ask you to warm the limb that will be checked with warm water, hot pack, or wrapping the limb in a blanket. Do not use lotions or creams on the same day that you will be having the procedure. What happens during the test? For EMG  Your health care provider will ask you to stay in a position so that the muscle being studied can be accessed. You will be sitting or lying down. You may be given a medicine to numb the area (local anesthetic) and the skin will be disinfected. A very thin needle that has an electrode will be inserted into your muscle, one muscle at a time. Typically, multiple muscles are evaluated during a single study. Another  small electrode will be placed on your skin near the muscle. Your health care provider will ask you to continue to remain still. The electrodes will record the electrical activity of your muscles. You may see this on a monitor or hear it in the room. After your muscles have been studied at rest, your health care provider will ask you to contract or flex your muscles. The electrodes will record the electrical activity of your muscles. Your health care provider will remove the electrodes and the electrode needle when the procedure is finished. The procedure may vary among health care providers and hospitals. For NCS  An electrode that records your nerve activity (recording electrode) will be placed on your skin by the muscle that is being studied. An electrode that is used as a reference (reference electrode) will be placed near the recording electrode. A paste or gel will be applied to your skin between the recording electrode and the reference electrode. Your nerve will be stimulated with a mild shock. The speed of the nerves and strength of response is recorded by the electrodes. Your health care provider will remove the electrodes and the gel when the procedure is finished. The procedure may vary among health care providers and hospitals. What can I expect after the test? It is up to you to get your test results. Ask your health care provider, or the department that is doing the test, when your results will be ready. Your health care provider may: Give you medicines for any pain. Monitor the insertion sites to make sure that bleeding stops. You should be able to drive yourself to and from the test. Discomfort can   persist for a few hours after the test, but should be better the next day. Contact a health care provider if: You have swelling, redness, or drainage at any of the insertion sites. Summary Electromyoneurogram is a test to check how well your muscles and nerves are working. If the  results of the tests are abnormal, this may indicate disease or injury. This is a safe procedure. However, problems may occur, such as bleeding and infection. Your health care provider will do two tests to complete this procedure. One checks your muscles (EMG) and another checks your nerves (NCS). It is up to you to get your test results. Ask your health care provider, or the department that is doing the test, when your results will be ready. This information is not intended to replace advice given to you by your health care provider. Make sure you discuss any questions you have with your health care provider. Document Revised: 09/02/2020 Document Reviewed: 08/02/2020 Elsevier Patient Education  2023 Elsevier Inc.  

## 2022-05-08 DIAGNOSIS — M41129 Adolescent idiopathic scoliosis, site unspecified: Secondary | ICD-10-CM | POA: Diagnosis not present

## 2022-05-08 DIAGNOSIS — M546 Pain in thoracic spine: Secondary | ICD-10-CM | POA: Diagnosis not present

## 2022-05-08 DIAGNOSIS — M545 Low back pain, unspecified: Secondary | ICD-10-CM | POA: Diagnosis not present

## 2022-05-16 DIAGNOSIS — N950 Postmenopausal bleeding: Secondary | ICD-10-CM | POA: Diagnosis not present

## 2022-05-16 DIAGNOSIS — N63 Unspecified lump in unspecified breast: Secondary | ICD-10-CM | POA: Diagnosis not present

## 2022-05-17 ENCOUNTER — Other Ambulatory Visit: Payer: Self-pay | Admitting: Obstetrics

## 2022-05-17 DIAGNOSIS — N6310 Unspecified lump in the right breast, unspecified quadrant: Secondary | ICD-10-CM

## 2022-06-10 ENCOUNTER — Ambulatory Visit
Admission: RE | Admit: 2022-06-10 | Discharge: 2022-06-10 | Disposition: A | Discharge status: Home / Self Care | Payer: BC Managed Care – PPO | Source: Ambulatory Visit | Attending: Obstetrics | Admitting: Obstetrics

## 2022-06-10 DIAGNOSIS — N6001 Solitary cyst of right breast: Secondary | ICD-10-CM | POA: Diagnosis not present

## 2022-06-10 DIAGNOSIS — N6310 Unspecified lump in the right breast, unspecified quadrant: Secondary | ICD-10-CM

## 2022-06-30 ENCOUNTER — Telehealth: Payer: Self-pay

## 2022-06-30 NOTE — Telephone Encounter (Signed)
I left a voicemail for the patient requesting that they arrive for their appointment for nerve conduction studies at 2:15 PM on 07/04/2022. I advised that an automated system may call them with an incorrect time, however 2:15 PM is their check in time, with a 2:30 PM appointment. I have provided the office number for a call back with any questions regarding their appointment.

## 2022-07-04 ENCOUNTER — Ambulatory Visit: Payer: Self-pay | Admitting: Neurology

## 2022-07-04 ENCOUNTER — Ambulatory Visit (INDEPENDENT_AMBULATORY_CARE_PROVIDER_SITE_OTHER): Payer: BC Managed Care – PPO | Admitting: Neurology

## 2022-07-04 DIAGNOSIS — R002 Palpitations: Secondary | ICD-10-CM

## 2022-07-04 DIAGNOSIS — R253 Fasciculation: Secondary | ICD-10-CM

## 2022-07-04 DIAGNOSIS — Z0289 Encounter for other administrative examinations: Secondary | ICD-10-CM

## 2022-07-04 NOTE — Progress Notes (Signed)
Full Name: Cynthia Nguyen Gender: Female MRN #: [id number] Date of Birth: 24-Oct-1970    Visit Date: 07/04/2022 15:03 Age: 52 Years Examining Physician: Dr. Naomie Dean Referring Physician: Dr. Porfirio Mylar Dohmeier Height: 5 feet 3 inch    History: Patient with isolated migratory fasciculations/muscle twitching since covid in march in entire body random. Denies radicular symptoms.  Summary: NCS performed on bilateral lowers and left upper. EMG needle study performed on left upper and left lower extremities and thoracic paraspinals. All nerves and muscles (as indicated in the following tables) were within normal limits.     Conclusion: This is a normal study.  No electrophysiologic evidence for mononeuropathy, polyneuropathy, radiculopathy or neurogenic muscle disease.  ------------------------------- Naomie Dean, M.D.  [facility] [address] [place], [place] [address] Tel: [phone] Fax: [phone]  Verbal informed consent was obtained from the patient, patient was informed of potential risk of procedure, including bruising, bleeding, hematoma formation, infection, muscle weakness, muscle pain, numbness, among others.        MNC    Nerve / Sites Muscle Latency Ref. Amplitude Ref. Rel Amp Segments Distance Velocity Ref. Area    ms ms mV mV %  cm m/s m/s mVms  L Median - APB     Wrist APB 3.5 ?4.4 8.0 ?4.0 100 Wrist - APB 7   26.2     Upper arm APB 8.9  7.9  99.3 Upper arm - Wrist 27 50 ?49 27.7  L Ulnar - ADM     Wrist ADM 3.2 ?3.3 7.3 ?6.0 100 Wrist - ADM 7   30.4     B.Elbow ADM 5.4  5.5  75.7 B.Elbow - Wrist 11 49 ?49 20.7     A.Elbow ADM 8.7  4.6  83.1 A.Elbow - B.Elbow 16 50 ?49 17.8  L Peroneal - EDB     Ankle EDB 6.5 ?6.5 3.5 ?2.0 100 Ankle - EDB 9   16.0     Fib head EDB 13.0  3.4  99 Fib head - Ankle 29 44 ?44 15.8     Pop fossa EDB 15.4  4.8  141 Pop fossa - Fib head 10.6 45 ?44 22.7         Pop fossa - Ankle      R Peroneal  - EDB     Ankle EDB 5.8 ?6.5 3.0 ?2.0 100 Ankle - EDB 9   13.5     Fib head EDB 13.1  3.3  113 Fib head - Ankle 28 38 ?44 15.3     Pop fossa EDB 15.4  3.7  112 Pop fossa - Fib head 10 45 ?44 16.4         Pop fossa - Ankle      L Tibial - AH     Ankle AH 5.6 ?5.8 5.9 ?4.0 100 Ankle - AH 9   33.3     Pop fossa AH 14.2  4.5  75.4 Pop fossa - Ankle 36 42 ?41 20.4  R Tibial - AH     Ankle AH 5.7 ?5.8 10.5 ?4.0 100 Ankle - AH 9   35.2     Pop fossa AH 14.2  6.9  66.2 Pop fossa - Ankle 35 41 ?41 24.4                 SNC    Nerve / Sites Rec. Site Peak Lat Ref.  Amp Ref. Segments Distance Peak Diff Ref.  ms ms V V  cm ms ms  L Sural - Ankle (Calf)     Calf Ankle 4.2 ?4.4 15 ?6 Calf - Ankle 14    R Sural - Ankle (Calf)     Calf Ankle 3.6 ?4.4 20 ?6 Calf - Ankle 14    L Superficial peroneal - Ankle     Lat leg Ankle 4.0 ?4.4 11 ?6 Lat leg - Ankle 14    R Superficial peroneal - Ankle     Lat leg Ankle 4.4 ?4.4 20 ?6 Lat leg - Ankle 14    L Median, Ulnar - Transcarpal comparison     Median Palm Wrist 2.3 ?2.2 106 ?35 Median Palm - Wrist 8       Ulnar Palm Wrist 1.9 ?2.2 51 ?12 Ulnar Palm - Wrist 8          Median Palm - Ulnar Palm  0.4 ?0.4  L Median - Orthodromic (Dig II, Mid palm)     Dig II Wrist 3.3 ?3.4 34 ?10 Dig II - Wrist 13    L Ulnar - Orthodromic, (Dig V, Mid palm)     Dig V Wrist 3.1 ?3.1 14 ?5 Dig V - Wrist 46                     F  Wave    Nerve F Lat Ref.   ms ms  L Tibial - AH 54.8 ?56.0  R Tibial - AH 58.4 ?56.0  L Ulnar - ADM 29.4 ?32.0           EMG Summary Table    Spontaneous MUAP Recruitment  Muscle IA Fib PSW Fasc Other Amp Dur. Poly Pattern  L. Deltoid Normal None None None _______ Normal Normal Normal Normal  L. Biceps brachii Normal None None None _______ Normal Normal Normal Normal  L. Triceps brachii Normal None None None _______ Normal Normal Normal Normal  L. Extensor indicis proprius Normal None None None _______ Normal Normal Normal Normal  L.  First dorsal interosseous Normal None None None _______ Normal Normal Normal Normal  L. Iliopsoas Normal None None None _______ Normal Normal Normal Normal  L. Vastus medialis Normal None None None _______ Normal Normal Normal Normal  L. Tibialis anterior Normal None None None _______ Normal Normal Normal Normal  L. Gastrocnemius (Medial head) Normal None None None _______ Normal Normal Normal Normal  L. Extensor hallucis longus Normal None None None _______ Normal Normal Normal Normal  L. Abductor hallucis Normal None None None _______ Normal Normal Normal Normal  L. Thoracic paraspinals (mid) Normal None None None _______ Normal Normal Normal Normal

## 2022-07-10 NOTE — Patient Instructions (Signed)
Discussed benign fasciculations

## 2022-07-10 NOTE — Procedures (Signed)
Full Name: Cynthia Nguyen Gender: Female MRN #: [id number] Date of Birth: [birth year]    Visit Date: 07/04/2022 15:03 Age: 52 Years Examining Physician: Dr. Naomie Dean Referring Physician: Dr. Porfirio Mylar Dohmeier Height: 5 feet 3 inch    History: Patient with isolated migratory fasciculations/muscle twitching since covid in march in entire body random. Denies radicular symptoms.  Summary: NCS performed on bilateral lowers and left upper. EMG needle study performed on left upper and left lower extremities and thoracic paraspinals. All nerves and muscles (as indicated in the following tables) were within normal limits.     Conclusion: This is a normal study.  No electrophysiologic evidence for mononeuropathy, polyneuropathy, radiculopathy or neurogenic muscle disease.  ------------------------------- Naomie Dean, M.D.  [facility] [address] [place], [place] [address] Tel: [phone] Fax: [phone]  Verbal informed consent was obtained from the patient, patient was informed of potential risk of procedure, including bruising, bleeding, hematoma formation, infection, muscle weakness, muscle pain, numbness, among others.        MNC    Nerve / Sites Muscle Latency Ref. Amplitude Ref. Rel Amp Segments Distance Velocity Ref. Area    ms ms mV mV %  cm m/s m/s mVms  L Median - APB     Wrist APB 3.5 ?4.4 8.0 ?4.0 100 Wrist - APB 7   26.2     Upper arm APB 8.9  7.9  99.3 Upper arm - Wrist 27 50 ?49 27.7  L Ulnar - ADM     Wrist ADM 3.2 ?3.3 7.3 ?6.0 100 Wrist - ADM 7   30.4     B.Elbow ADM 5.4  5.5  75.7 B.Elbow - Wrist 11 49 ?49 20.7     A.Elbow ADM 8.7  4.6  83.1 A.Elbow - B.Elbow 16 50 ?49 17.8  L Peroneal - EDB     Ankle EDB 6.5 ?6.5 3.5 ?2.0 100 Ankle - EDB 9   16.0     Fib head EDB 13.0  3.4  99 Fib head - Ankle 29 44 ?44 15.8     Pop fossa EDB 15.4  4.8  141 Pop fossa - Fib head 10.6 45 ?44 22.7         Pop fossa - Ankle      R Peroneal  - EDB     Ankle EDB 5.8 ?6.5 3.0 ?2.0 100 Ankle - EDB 9   13.5     Fib head EDB 13.1  3.3  113 Fib head - Ankle 28 38 ?44 15.3     Pop fossa EDB 15.4  3.7  112 Pop fossa - Fib head 10 45 ?44 16.4         Pop fossa - Ankle      L Tibial - AH     Ankle AH 5.6 ?5.8 5.9 ?4.0 100 Ankle - AH 9   33.3     Pop fossa AH 14.2  4.5  75.4 Pop fossa - Ankle 36 42 ?41 20.4  R Tibial - AH     Ankle AH 5.7 ?5.8 10.5 ?4.0 100 Ankle - AH 9   35.2     Pop fossa AH 14.2  6.9  66.2 Pop fossa - Ankle 35 41 ?41 24.4                 SNC    Nerve / Sites Rec. Site Peak Lat Ref.  Amp Ref. Segments Distance Peak Diff Ref.  ms ms V V  cm ms ms  L Sural - Ankle (Calf)     Calf Ankle 4.2 ?4.4 15 ?6 Calf - Ankle 14    R Sural - Ankle (Calf)     Calf Ankle 3.6 ?4.4 20 ?6 Calf - Ankle 14    L Superficial peroneal - Ankle     Lat leg Ankle 4.0 ?4.4 11 ?6 Lat leg - Ankle 14    R Superficial peroneal - Ankle     Lat leg Ankle 4.4 ?4.4 20 ?6 Lat leg - Ankle 14    L Median, Ulnar - Transcarpal comparison     Median Palm Wrist 2.3 ?2.2 106 ?35 Median Palm - Wrist 8       Ulnar Palm Wrist 1.9 ?2.2 51 ?12 Ulnar Palm - Wrist 8          Median Palm - Ulnar Palm  0.4 ?0.4  L Median - Orthodromic (Dig II, Mid palm)     Dig II Wrist 3.3 ?3.4 34 ?10 Dig II - Wrist 13    L Ulnar - Orthodromic, (Dig V, Mid palm)     Dig V Wrist 3.1 ?3.1 14 ?5 Dig V - Wrist 54                     F  Wave    Nerve F Lat Ref.   ms ms  L Tibial - AH 54.8 ?56.0  R Tibial - AH 58.4 ?56.0  L Ulnar - ADM 29.4 ?32.0           EMG Summary Table    Spontaneous MUAP Recruitment  Muscle IA Fib PSW Fasc Other Amp Dur. Poly Pattern  L. Deltoid Normal None None None _______ Normal Normal Normal Normal  L. Biceps brachii Normal None None None _______ Normal Normal Normal Normal  L. Triceps brachii Normal None None None _______ Normal Normal Normal Normal  L. Extensor indicis proprius Normal None None None _______ Normal Normal Normal Normal  L.  First dorsal interosseous Normal None None None _______ Normal Normal Normal Normal  L. Iliopsoas Normal None None None _______ Normal Normal Normal Normal  L. Vastus medialis Normal None None None _______ Normal Normal Normal Normal  L. Tibialis anterior Normal None None None _______ Normal Normal Normal Normal  L. Gastrocnemius (Medial head) Normal None None None _______ Normal Normal Normal Normal  L. Extensor hallucis longus Normal None None None _______ Normal Normal Normal Normal  L. Abductor hallucis Normal None None None _______ Normal Normal Normal Normal  L. Thoracic paraspinals (mid) Normal None None None _______ Normal Normal Normal Normal

## 2022-07-11 ENCOUNTER — Telehealth: Payer: Self-pay | Admitting: Anesthesiology

## 2022-07-11 NOTE — Telephone Encounter (Signed)
-----   Message from Melvyn Novas, MD sent at 07/10/2022  5:22 PM EDT ----- Normal EMG and NCV study.   Cc Dr Kateri Plummer.

## 2022-07-14 ENCOUNTER — Ambulatory Visit: Payer: BC Managed Care – PPO | Admitting: Neurology

## 2022-07-20 ENCOUNTER — Encounter: Payer: Self-pay | Admitting: Cardiology

## 2022-08-22 DIAGNOSIS — Z3202 Encounter for pregnancy test, result negative: Secondary | ICD-10-CM | POA: Diagnosis not present

## 2022-08-22 DIAGNOSIS — N950 Postmenopausal bleeding: Secondary | ICD-10-CM | POA: Diagnosis not present

## 2022-08-29 ENCOUNTER — Ambulatory Visit: Payer: Managed Care, Other (non HMO) | Admitting: Cardiology

## 2022-10-31 ENCOUNTER — Ambulatory Visit: Payer: Self-pay | Admitting: Cardiology

## 2022-11-09 DIAGNOSIS — R509 Fever, unspecified: Secondary | ICD-10-CM | POA: Diagnosis not present

## 2022-11-09 DIAGNOSIS — R059 Cough, unspecified: Secondary | ICD-10-CM | POA: Diagnosis not present

## 2022-11-09 DIAGNOSIS — J988 Other specified respiratory disorders: Secondary | ICD-10-CM | POA: Diagnosis not present

## 2022-11-09 DIAGNOSIS — Z03818 Encounter for observation for suspected exposure to other biological agents ruled out: Secondary | ICD-10-CM | POA: Diagnosis not present

## 2022-11-13 NOTE — Progress Notes (Unsigned)
  Cardiology Office Note:   Date:  11/14/2022  ID:  Cynthia Nguyen, DOB 09/03/1970, MRN [id number] PCP: Farris Has, MD  [facility] Providers Cardiologist:  Rollene Rotunda, MD {  History of Present Illness:   Cynthia Nguyen is a 52 y.o. female who presents for evaluation of palpitations.     She had tachycardia at age 89 treated with beta blockers.  She came off of these in her mid 59s.  She was having more palpitations in 2015 and had a Holter with no significant arrhythmias.  She had a negative stress test in 2017.  Coronary calcium was zero.  Echo was normal.     She has had continued palpitations.  We tried to change her atenolol to 25 mg bid.  She found it easier to take 50 mg once a day and she has been doing that.   Since I last saw her she has had no new cardiovascular complaints.  She has had some muscle fasciculations which she has had evaluated by neurology.  She says that she is not particularly bothered by her palpitations.   The patient denies any new symptoms such as chest discomfort, neck or arm discomfort. There has been no new shortness of breath, PND or orthopnea. There have been no reported palpitations, presyncope or syncope.   ROS: As stated in the HPI and negative for all other systems.  Studies Reviewed:    EKG:   EKG Interpretation Date/Time:  Monday November 14 2022 09:53:52 EST Ventricular Rate:  64 PR Interval:  168 QRS Duration:  68 QT Interval:  406 QTC Calculation: 418 R Axis:   -16  Text Interpretation: Normal sinus rhythm Low voltage QRS Poor anterior R wave progression No significant change since last tracing Confirmed by Rollene Rotunda ([id number]) on 11/14/2022 10:28:25 AM     Risk Assessment/Calculations:           Physical Exam:   VS:  BP 110/66 (BP Location: Left Arm, Patient Position: Sitting, Cuff Size: Normal)   Pulse 64   Ht 5\' 3"  (1.6 m)   Wt 144 lb 9.6 oz (65.6 kg)   SpO2 98%   BMI 25.61 kg/m    Wt Readings from Last 3  Encounters:  11/14/22 144 lb 9.6 oz (65.6 kg)  05/03/22 138 lb 12.8 oz (63 kg)  02/28/22 138 lb (62.6 kg)     GEN: Well nourished, well developed in no acute distress NECK: No JVD; No carotid bruits CARDIAC: RRR, no murmurs, rubs, gallops RESPIRATORY:  Clear to auscultation without rales, wheezing or rhonchi  ABDOMEN: Soft, non-tender, non-distended EXTREMITIES:  No edema; No deformity   ASSESSMENT AND PLAN:   PALPITATIONS:    She is doing well on current meds.  No change in therapy.  Call Ms. Kluender with the results and send results to Farris Has, MD   SLEEP:   I am going to refer her to neurology specifically to talk about her sleep issues.        Follow up with me in one year.   Signed, Rollene Rotunda, MD

## 2022-11-14 ENCOUNTER — Ambulatory Visit: Payer: BC Managed Care – PPO | Attending: Cardiology | Admitting: Cardiology

## 2022-11-14 ENCOUNTER — Encounter: Payer: Self-pay | Admitting: Cardiology

## 2022-11-14 ENCOUNTER — Other Ambulatory Visit: Payer: Self-pay | Admitting: Cardiology

## 2022-11-14 VITALS — BP 110/66 | HR 64 | Ht 63.0 in | Wt 144.6 lb

## 2022-11-14 DIAGNOSIS — G479 Sleep disorder, unspecified: Secondary | ICD-10-CM

## 2022-11-14 DIAGNOSIS — R002 Palpitations: Secondary | ICD-10-CM

## 2022-11-14 MED ORDER — DILTIAZEM HCL ER COATED BEADS 120 MG PO CP24: 120.0000 mg | ORAL_CAPSULE | Freq: Every day | ORAL | 3 refills | Status: DC

## 2022-11-14 MED ORDER — TENORMIN 50 MG PO TABS: 50.0000 mg | ORAL_TABLET | Freq: Every day | ORAL | 3 refills | Status: DC

## 2022-11-14 NOTE — Patient Instructions (Signed)
Medication Instructions:  No changes *If you need a refill on your cardiac medications before your next appointment, please call your pharmacy*   Follow-Up: At [facility], you and your health needs are our priority.  As part of our continuing mission to provide you with exceptional heart care, we have created designated Provider Care Teams.  These Care Teams include your primary Cardiologist (physician) and Advanced Practice Providers (APPs -  Physician Assistants and Nurse Practitioners) who all work together to provide you with the care you need, when you need it.  We recommend signing up for the patient portal called "MyChart".  Sign up information is provided on this After Visit Summary.  MyChart is used to connect with patients for Virtual Visits (Telemedicine).  Patients are able to view lab/test results, encounter notes, upcoming appointments, etc.  Non-urgent messages can be sent to your provider as well.   To learn more about what you can do with MyChart, go to [email or web address].    Your next appointment:   1 year(s)  Provider:   Minus Breeding, MD

## 2022-11-22 ENCOUNTER — Telehealth: Payer: Self-pay | Admitting: Pharmacy Technician

## 2022-11-22 ENCOUNTER — Other Ambulatory Visit (HOSPITAL_COMMUNITY): Payer: Self-pay

## 2022-11-22 NOTE — Telephone Encounter (Signed)
Pharmacy Patient Advocate Encounter   Received notification from CoverMyMeds that prior authorization for Tenormin (brand) is required/requested.   Insurance verification completed.   The patient is insured through [facility] .   Per test claim: PA required; PA submitted to above mentioned insurance via CoverMyMeds Key/confirmation #/EOC [id number] Status is pending

## 2022-11-23 ENCOUNTER — Other Ambulatory Visit: Payer: Self-pay | Admitting: Cardiology

## 2022-11-28 DIAGNOSIS — L814 Other melanin hyperpigmentation: Secondary | ICD-10-CM | POA: Diagnosis not present

## 2022-11-28 DIAGNOSIS — R208 Other disturbances of skin sensation: Secondary | ICD-10-CM | POA: Diagnosis not present

## 2022-11-28 DIAGNOSIS — L821 Other seborrheic keratosis: Secondary | ICD-10-CM | POA: Diagnosis not present

## 2022-11-28 DIAGNOSIS — L2989 Other pruritus: Secondary | ICD-10-CM | POA: Diagnosis not present

## 2022-11-28 DIAGNOSIS — L538 Other specified erythematous conditions: Secondary | ICD-10-CM | POA: Diagnosis not present

## 2022-11-28 DIAGNOSIS — D225 Melanocytic nevi of trunk: Secondary | ICD-10-CM | POA: Diagnosis not present

## 2022-11-28 DIAGNOSIS — L728 Other follicular cysts of the skin and subcutaneous tissue: Secondary | ICD-10-CM | POA: Diagnosis not present

## 2022-11-29 ENCOUNTER — Other Ambulatory Visit: Payer: Self-pay

## 2022-11-29 ENCOUNTER — Other Ambulatory Visit (HOSPITAL_COMMUNITY): Payer: Self-pay

## 2022-11-29 MED ORDER — ATENOLOL 50 MG PO TABS: 50.0000 mg | ORAL_TABLET | Freq: Every day | ORAL | 3 refills | Status: DC

## 2022-11-29 NOTE — Telephone Encounter (Signed)
Pharmacy Patient Advocate Encounter  Received notification from [facility] that Prior Authorization for Tenormin has been DENIED.  See denial reason below. No denial letter attached in CMM. Will attach denial letter to Media tab once received.   PA #/Case ID/Reference #: [id number]

## 2022-12-16 DIAGNOSIS — Z1331 Encounter for screening for depression: Secondary | ICD-10-CM | POA: Diagnosis not present

## 2022-12-16 DIAGNOSIS — Z01419 Encounter for gynecological examination (general) (routine) without abnormal findings: Secondary | ICD-10-CM | POA: Diagnosis not present

## 2022-12-26 ENCOUNTER — Other Ambulatory Visit: Payer: Self-pay | Admitting: Obstetrics

## 2022-12-26 DIAGNOSIS — Z1231 Encounter for screening mammogram for malignant neoplasm of breast: Secondary | ICD-10-CM

## 2023-01-13 ENCOUNTER — Emergency Department (HOSPITAL_BASED_OUTPATIENT_CLINIC_OR_DEPARTMENT_OTHER): Payer: BC Managed Care – PPO

## 2023-01-13 ENCOUNTER — Emergency Department (HOSPITAL_BASED_OUTPATIENT_CLINIC_OR_DEPARTMENT_OTHER): Payer: BC Managed Care – PPO | Admitting: Radiology

## 2023-01-13 ENCOUNTER — Emergency Department (HOSPITAL_BASED_OUTPATIENT_CLINIC_OR_DEPARTMENT_OTHER)
Admission: EM | Admit: 2023-01-13 | Discharge: 2023-01-13 | Disposition: A | Discharge status: Home / Self Care | Payer: BC Managed Care – PPO | Attending: Emergency Medicine | Admitting: Emergency Medicine

## 2023-01-13 ENCOUNTER — Encounter (HOSPITAL_BASED_OUTPATIENT_CLINIC_OR_DEPARTMENT_OTHER): Payer: Self-pay | Admitting: Emergency Medicine

## 2023-01-13 ENCOUNTER — Other Ambulatory Visit: Payer: Self-pay

## 2023-01-13 DIAGNOSIS — R001 Bradycardia, unspecified: Secondary | ICD-10-CM

## 2023-01-13 DIAGNOSIS — R42 Dizziness and giddiness: Secondary | ICD-10-CM | POA: Insufficient documentation

## 2023-01-13 DIAGNOSIS — R002 Palpitations: Secondary | ICD-10-CM | POA: Insufficient documentation

## 2023-01-13 DIAGNOSIS — Z79899 Other long term (current) drug therapy: Secondary | ICD-10-CM | POA: Insufficient documentation

## 2023-01-13 LAB — URINALYSIS, ROUTINE W REFLEX MICROSCOPIC
Bilirubin Urine: NEGATIVE
Glucose, UA: NEGATIVE mg/dL
Hgb urine dipstick: NEGATIVE
Ketones, ur: NEGATIVE mg/dL
Leukocytes,Ua: NEGATIVE
Nitrite: NEGATIVE
Protein, ur: NEGATIVE mg/dL
Specific Gravity, Urine: 1.005 (ref 1.005–1.030)
pH: 7 (ref 5.0–8.0)

## 2023-01-13 LAB — COMPREHENSIVE METABOLIC PANEL
ALT: 19 U/L (ref 0–44)
AST: 26 U/L (ref 15–41)
Albumin: 3.9 g/dL (ref 3.5–5.0)
Alkaline Phosphatase: 50 U/L (ref 38–126)
Anion gap: 6 (ref 5–15)
BUN: 14 mg/dL (ref 6–20)
CO2: 28 mmol/L (ref 22–32)
Calcium: 8.9 mg/dL (ref 8.9–10.3)
Chloride: 104 mmol/L (ref 98–111)
Creatinine, Ser: 0.84 mg/dL (ref 0.44–1.00)
GFR, Estimated: >60 mL/min (ref >60)
Glucose, Bld: 84 mg/dL (ref 70–99)
Potassium: 4.1 mmol/L (ref 3.5–5.1)
Sodium: 138 mmol/L (ref 135–145)
Total Bilirubin: 0.5 mg/dL (ref 0.0–1.2)
Total Protein: 6.9 g/dL (ref 6.5–8.1)

## 2023-01-13 LAB — CBC WITH DIFFERENTIAL/PLATELET
Abs Immature Granulocytes: 0.01 10*3/uL (ref 0.00–0.07)
Basophils Absolute: 0.1 10*3/uL (ref 0.0–0.1)
Basophils Relative: 1 %
Eosinophils Absolute: 0.1 10*3/uL (ref 0.0–0.5)
Eosinophils Relative: 2 %
HCT: 39.7 % (ref 36.0–46.0)
Hemoglobin: 14.1 g/dL (ref 12.0–15.0)
Immature Granulocytes: 0 %
Lymphocytes Relative: 33 %
Lymphs Abs: 1.9 10*3/uL (ref 0.7–4.0)
MCH: 32.3 pg (ref 26.0–34.0)
MCHC: 35.5 g/dL (ref 30.0–36.0)
MCV: 90.8 fL (ref 80.0–100.0)
Monocytes Absolute: 0.6 10*3/uL (ref 0.1–1.0)
Monocytes Relative: 10 %
Neutro Abs: 3 10*3/uL (ref 1.7–7.7)
Neutrophils Relative %: 54 %
Platelets: 150 10*3/uL (ref 150–400)
RBC: 4.37 MIL/uL (ref 3.87–5.11)
RDW: 11.8 % (ref 11.5–15.5)
WBC: 5.7 10*3/uL (ref 4.0–10.5)
nRBC: 0 % (ref 0.0–0.2)

## 2023-01-13 LAB — RAPID URINE DRUG SCREEN, HOSP PERFORMED
Amphetamines: NOT DETECTED
Barbiturates: NOT DETECTED
Benzodiazepines: NOT DETECTED
Cocaine: NOT DETECTED
Opiates: NOT DETECTED
Tetrahydrocannabinol: NOT DETECTED

## 2023-01-13 LAB — CBG MONITORING, ED: Glucose-Capillary: 110 mg/dL — ABNORMAL HIGH (ref 70–99)

## 2023-01-13 MED ORDER — SODIUM CHLORIDE 0.9 % IV BOLUS
1000.0000 mL | Freq: Once | INTRAVENOUS | Status: AC
  Administered 2023-01-13: 1000 mL via INTRAVENOUS

## 2023-01-13 NOTE — ED Notes (Signed)
 Pt in restroom, delaying triage

## 2023-01-13 NOTE — ED Provider Notes (Signed)
 [facility] AT [facility] Provider Note   CSN: [id number] Arrival date & time: 01/13/23  9175     History  Chief Complaint  Patient presents with   Dizziness    Cynthia Nguyen is a 53 y.o. female.  Pt is a 53 yo female with pmhx significant for palpitations.  She woke up feeling well and then took her meds.  She is not sure if she took a double dose or not.  Shortly after taking meds, she started feeling dizzy.  Pt said she feels unsteady on her feet.  Her speech is slowed.  She says she feels too relaxed like she drank 3 Cosmos.       Home Medications Prior to Admission medications   Medication Sig Start Date End Date Taking? Authorizing Provider  atenolol  (TENORMIN ) 50 MG tablet Take 1 tablet (50 mg total) by mouth daily. 11/29/22   Lavona Agent, MD  Biotin w/ Vitamins C & E (HAIR/SKIN/NAILS PO) Take by mouth daily. Patient not taking: Reported on 11/14/2022    [provider]  calcium carbonate (OS-CAL) 600 MG TABS tablet Take 600 mg by mouth 2 (two) times daily with a meal.  Patient not taking: Reported on 11/14/2022    [provider]  Cholecalciferol (VITAMIN D3) 50 MCG (2000 UT) TABS Take 1 tablet by mouth daily. Patient not taking: Reported on 11/14/2022    [provider]  diltiazem  (CARDIZEM  CD) 120 MG 24 hr capsule Take 1 capsule (120 mg total) by mouth daily. 11/14/22   Lavona Agent, MD  estradiol (VIVELLE-DOT) 0.1 MG/24HR patch Place 1 patch onto the skin 2 (two) times a week. 03/10/20   [provider]  Magnesium 250 MG TABS Take 250 mg by mouth daily. Patient not taking: Reported on 11/14/2022    [provider]  MELATONIN PO Take 3 mg by mouth at bedtime.    [provider]  NON FORMULARY Uria Forty nail gel- Apply once daily nightly    [provider]  progesterone (PROMETRIUM) 100 MG capsule Take 1 capsule by mouth daily. Taking 200 mg    [provider]       Allergies    Cephalexin    Review of Systems   Review of Systems  Neurological:  Positive for dizziness.  All other systems reviewed and are negative.   Physical Exam Updated Vital Signs BP 114/82   Pulse (!) 57   Temp (!) 97.5 F (36.4 C) (Oral)   Resp 16   SpO2 99%  Physical Exam Vitals and nursing note reviewed.  Constitutional:      Appearance: Normal appearance.  HENT:     Head: Normocephalic and atraumatic.     Right Ear: External ear normal.     Left Ear: External ear normal.     Nose: Nose normal.     Mouth/Throat:     Mouth: Mucous membranes are moist.     Pharynx: Oropharynx is clear.  Eyes:     Extraocular Movements: Extraocular movements intact.     Conjunctiva/sclera: Conjunctivae normal.     Pupils: Pupils are equal, round, and reactive to light.  Cardiovascular:     Rate and Rhythm: Regular rhythm. Bradycardia present.     Pulses: Normal pulses.     Heart sounds: Normal heart sounds.  Pulmonary:     Effort: Pulmonary effort is normal.     Breath sounds: Normal breath sounds.  Abdominal:     General: Abdomen is  flat. Bowel sounds are normal.     Palpations: Abdomen is soft.  Musculoskeletal:        General: Normal range of motion.     Cervical back: Normal range of motion and neck supple.  Skin:    General: Skin is warm.     Capillary Refill: Capillary refill takes less than 2 seconds.  Neurological:     General: No focal deficit present.     Mental Status: She is alert and oriented to person, place, and time.  Psychiatric:        Mood and Affect: Mood normal.        Behavior: Behavior normal.     ED Results / Procedures / Treatments   Labs (all labs ordered are listed, but only abnormal results are displayed) Labs Reviewed  URINALYSIS, ROUTINE W REFLEX MICROSCOPIC - Abnormal; Notable for the following components:      Result Value   Color, Urine COLORLESS (*)    All other components within normal limits  CBG MONITORING, ED -  Abnormal; Notable for the following components:   Glucose-Capillary 110 (*)    All other components within normal limits  CBC WITH DIFFERENTIAL/PLATELET  COMPREHENSIVE METABOLIC PANEL  RAPID URINE DRUG SCREEN, HOSP PERFORMED    EKG EKG Interpretation Date/Time:  Friday January 13 2023 08:54:45 EST Ventricular Rate:  54 PR Interval:  177 QRS Duration:  99 QT Interval:  419 QTC Calculation: 397 R Axis:   5  Text Interpretation: Sinus rhythm Low voltage, extremity and precordial leads Minimal ST elevation, inferior leads No significant change since last tracing Confirmed by Dean Clarity [phone]) on 01/13/2023 9:11:42 AM  Radiology DG Chest 2 View Result Date: 01/13/2023 CLINICAL DATA:  Palpitations.  Altered mental status EXAM: CHEST - 2 VIEW COMPARISON:  X-ray 11/14/2014. FINDINGS: Moderate dextroconvex curvature of the midthoracic spine. No consolidation, pneumothorax or effusion. No edema. Normal cardiopericardial silhouette. Overlapping cardiac leads. IMPRESSION: No acute cardiopulmonary disease. Electronically Signed   By: Ranell Bring M.D.   On: 01/13/2023 10:04   CT Head Wo Contrast Result Date: 01/13/2023 CLINICAL DATA:  Altered mental status. EXAM: CT HEAD WITHOUT CONTRAST TECHNIQUE: Contiguous axial images were obtained from the base of the skull through the vertex without intravenous contrast. RADIATION DOSE REDUCTION: This exam was performed according to the departmental dose-optimization program which includes automated exposure control, adjustment of the mA and/or kV according to patient size and/or use of iterative reconstruction technique. COMPARISON:  None Available. FINDINGS: Brain: The ventricles and sulci are appropriate size for the patient's age. The gray-white matter discrimination is preserved. There is no acute intracranial hemorrhage. No mass effect or midline shift. No extra-axial fluid collection. Vascular: No hyperdense vessel or unexpected calcification. Skull:  Normal. Negative for fracture or focal lesion. Sinuses/Orbits: No acute finding. Other: None IMPRESSION: No acute intracranial pathology. Electronically Signed   By: Vanetta Chou M.D.   On: 01/13/2023 09:55    Procedures Procedures    Medications Ordered in ED Medications  sodium chloride  0.9 % bolus 1,000 mL (0 mLs Intravenous Stopped 01/13/23 1017)  sodium chloride  0.9 % bolus 1,000 mL (1,000 mLs Intravenous New Bag/Given 01/13/23 1020)    ED Course/ Medical Decision Making/ A&P                                 Medical Decision Making Amount and/or Complexity of Data Reviewed Labs: ordered. Radiology: ordered.  This patient presents to the ED for concern of dizziness, this involves an extensive number of treatment options, and is a complaint that carries with it a high risk of complications and morbidity.  The differential diagnosis includes cva, vertigo, dehydration, bradycardia   Co morbidities that complicate the patient evaluation  palpitations   Additional history obtained:  Additional history obtained from epic chart review External records from outside source obtained and reviewed including family   Lab Tests:  I Ordered, and personally interpreted labs.  The pertinent results include:  uds neg, cbc nl, ua neg   Imaging Studies ordered:  I ordered imaging studies including ct head, cxr  I independently visualized and interpreted imaging which showed  CXR: No acute cardiopulmonary disease.  CT  head: No acute intracranial pathology.  I agree with the radiologist interpretation   Cardiac Monitoring:  The patient was maintained on a cardiac monitor.  I personally viewed and interpreted the cardiac monitored which showed an underlying rhythm of: sb   Medicines ordered and prescription drug management:  I ordered medication including ivfs  for sx  Reevaluation of the patient after these medicines showed that the patient improved I have reviewed the  patients home medicines and have made adjustments as needed   Test Considered:  ct   Critical Interventions:  ivfs  Problem List / ED Course:  Bradycardia:  HR will sometimes drop in the upper 40s, but it mainly stays in the 50s and 60s.  I suspect she took an extra dose of her BB.  She is feeling better and is able to ambulate without feeling dizzy, so I don't think she needs to stay.  I doubt CVA as she has no other neuro sx and dizziness is not vestibular in nature, more of a light headed feeling.  She knows to return if worse.  F/u with pcp.   Reevaluation:  After the interventions noted above, I reevaluated the patient and found that they have :improved   Social Determinants of Health:  Lives at home   Dispostion:  After consideration of the diagnostic results and the patients response to treatment, I feel that the patent would benefit from discharge with outpatient f/u.          Final Clinical Impression(s) / ED Diagnoses Final diagnoses:  Bradycardia    Rx / DC Orders ED Discharge Orders     None         Dean Clarity, MD 01/13/23 1131

## 2023-01-13 NOTE — ED Triage Notes (Signed)
 Pt c/o upon waking after going to bathroom. Endorses hx of palpitations. Pt reports taking meds Atenolol , cardizem  and progesterone from Thurs at 0645 as she did not take yesterday. Reports dizziness starting at ~ 0715. Speech slow, but pt ao x 4. Pt bilaterally equal

## 2023-01-16 ENCOUNTER — Encounter: Payer: Self-pay | Admitting: Cardiology

## 2023-01-16 NOTE — Telephone Encounter (Signed)
 Patient identification verified by 2 forms. Bertina Cooks, RN    Called and spoke to patient  Patient states:   -has been taking Diltiazem  and Atenolol  together at bedtime over over 1 year   -unsure if she has always been experiencing symptoms but was unaware due to sleeping  Informed patient:   -Can take Rx separately, diltiazem  at night and atenolol  in the morning  Patient agrees, will start taking atenolol  in the morning beginning tomorrow  Patient has no further questions at this time

## 2023-01-18 ENCOUNTER — Ambulatory Visit
Admission: RE | Admit: 2023-01-18 | Discharge: 2023-01-18 | Disposition: A | Discharge status: Home / Self Care | Payer: BC Managed Care – PPO | Source: Ambulatory Visit | Attending: Obstetrics | Admitting: Obstetrics

## 2023-01-18 DIAGNOSIS — Z1231 Encounter for screening mammogram for malignant neoplasm of breast: Secondary | ICD-10-CM

## 2023-04-30 ENCOUNTER — Other Ambulatory Visit: Payer: Self-pay

## 2023-04-30 ENCOUNTER — Emergency Department (HOSPITAL_BASED_OUTPATIENT_CLINIC_OR_DEPARTMENT_OTHER)
Admission: EM | Admit: 2023-04-30 | Discharge: 2023-04-30 | Disposition: A | Discharge status: Home / Self Care | Attending: Emergency Medicine | Admitting: Emergency Medicine

## 2023-04-30 ENCOUNTER — Encounter (HOSPITAL_BASED_OUTPATIENT_CLINIC_OR_DEPARTMENT_OTHER): Payer: Self-pay

## 2023-04-30 DIAGNOSIS — S0592XA Unspecified injury of left eye and orbit, initial encounter: Secondary | ICD-10-CM | POA: Diagnosis present

## 2023-04-30 DIAGNOSIS — X58XXXA Exposure to other specified factors, initial encounter: Secondary | ICD-10-CM | POA: Insufficient documentation

## 2023-04-30 DIAGNOSIS — S0502XA Injury of conjunctiva and corneal abrasion without foreign body, left eye, initial encounter: Secondary | ICD-10-CM | POA: Diagnosis not present

## 2023-04-30 MED ORDER — FLUORESCEIN SODIUM 1 MG OP STRP
1.0000 | ORAL_STRIP | Freq: Once | OPHTHALMIC | Status: AC
  Administered 2023-04-30: 1 via OPHTHALMIC
  Filled 2023-04-30: qty 1

## 2023-04-30 MED ORDER — OFLOXACIN 0.3 % OP SOLN: 1.0000 [drp] | Freq: Four times a day (QID) | OPHTHALMIC | 0 refills | Status: AC

## 2023-04-30 MED ORDER — TETRACAINE HCL 0.5 % OP SOLN
2.0000 [drp] | Freq: Once | OPHTHALMIC | Status: AC
  Administered 2023-04-30: 2 [drp] via OPHTHALMIC
  Filled 2023-04-30: qty 8

## 2023-04-30 NOTE — ED Provider Notes (Signed)
 [place] EMERGENCY DEPARTMENT AT [facility] Provider Note   CSN: [id number] Arrival date & time: 04/30/23  5784     History  Chief Complaint  Patient presents with   Eye Injury    Cynthia Nguyen is a 53 y.o. female who presents to the emergency department with chief complaint of injury to her left eye.  She was outside cleaning a crate Myrtle tree and then states "I do not know what happened but one of the branches ended up in my eye."  She had immediate severe discomfort, watering.  She has a little bit of blurry vision in the left eye.  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  She does reading glasses but does not need corrective lenses to see regularly.  She does not wear contacts.  This occurred about 30 minutes prior to arrival.   Eye Injury       Home Medications Prior to Admission medications   Medication Sig Start Date End Date Taking? Authorizing Provider  atenolol  (TENORMIN ) 50 MG tablet Take 1 tablet (50 mg total) by mouth daily. 11/29/22   Eilleen Grates, MD  Biotin w/ Vitamins C & E (HAIR/SKIN/NAILS PO) Take by mouth daily. Patient not taking: Reported on 11/14/2022    [provider]  calcium carbonate (OS-CAL) 600 MG TABS tablet Take 600 mg by mouth 2 (two) times daily with a meal.  Patient not taking: Reported on 11/14/2022    [provider]  Cholecalciferol (VITAMIN D3) 50 MCG (2000 UT) TABS Take 1 tablet by mouth daily. Patient not taking: Reported on 11/14/2022    [provider]  diltiazem  (CARDIZEM  CD) 120 MG 24 hr capsule Take 1 capsule (120 mg total) by mouth daily. 11/14/22   Eilleen Grates, MD  estradiol (VIVELLE-DOT) 0.1 MG/24HR patch Place 1 patch onto the skin 2 (two) times a week. 03/10/20   [provider]  Magnesium 250 MG TABS Take 250 mg by mouth daily. Patient not taking: Reported on 11/14/2022    [provider]  MELATONIN PO Take 3 mg by mouth at bedtime.    [provider]  NON FORMULARY Uria Forty nail gel- Apply once daily nightly    [provider]  progesterone (PROMETRIUM) 100 MG capsule Take 1 capsule by mouth daily. Taking 200 mg    [provider]      Allergies    Cephalexin    Review of Systems   Review of Systems  Physical Exam Updated Vital Signs BP 117/73 (BP Location: Right Arm)   Pulse (!) 56   Temp 98.9 F (37.2 C) (Oral)   Resp 16   Ht 5\' 3"  (1.6 m)   Wt 65.8 kg   SpO2 100%   BMI 25.69 kg/m  Physical Exam Vitals and nursing note reviewed.  Constitutional:      General: She is not in acute distress.    Appearance: She is well-developed. She is not diaphoretic.  HENT:     Head: Normocephalic and atraumatic.     Right Ear: External ear normal.     Left Ear: External ear normal.     Nose: Nose normal.     Mouth/Throat:     Mouth: Mucous membranes are moist.  Eyes:     General: Vision grossly intact. No scleral icterus.       Right eye: No foreign body or discharge.        Left eye: No foreign body or  discharge.     Extraocular Movements: Extraocular movements intact.     Conjunctiva/sclera:     Left eye: Left conjunctiva is injected. No chemosis, exudate or hemorrhage.    Pupils: Pupils are equal, round, and reactive to light.     Left eye: Fluorescein uptake present. Seidel exam negative.    Slit lamp exam:    Left eye: Anterior chamber quiet. No corneal ulcer or photophobia.  Cardiovascular:     Rate and Rhythm: Normal rate and regular rhythm.     Heart sounds: Normal heart sounds. No murmur heard.    No friction rub. No gallop.  Pulmonary:     Effort: Pulmonary effort is normal. No respiratory distress.     Breath sounds: Normal breath sounds.  Abdominal:     General: Bowel sounds are normal. There is no distension.     Palpations: Abdomen is soft. There is no mass.     Tenderness: There is no abdominal tenderness. There is no guarding.  Musculoskeletal:     Cervical back: Normal  range of motion.  Skin:    General: Skin is warm and dry.  Neurological:     Mental Status: She is alert and oriented to person, place, and time.  Psychiatric:        Behavior: Behavior normal.     ED Results / Procedures / Treatments   Labs (all labs ordered are listed, but only abnormal results are displayed) Labs Reviewed - No data to display  EKG None  Radiology No results found.  Procedures Procedures    Medications Ordered in ED Medications  tetracaine (PONTOCAINE) 0.5 % ophthalmic solution 2 drop (2 drops Left Eye Given by Other 04/30/23 0942)  fluorescein ophthalmic strip 1 strip (1 strip Left Eye Given by Other 04/30/23 6962)    ED Course/ Medical Decision Making/ A&P                                 Medical Decision Making Risk Prescription drug management.   Corneal abrasion  Pt with corneal abrasion on PE. No evidence of FB.  Vision grossly intact.  Pt is not a contact lens wearer.  Exam non-concerning for orbital cellulitis, hyphema, corneal ulcers. Patient will be discharged home with Ocuflox..   Patient understands to follow up with ophthalmology, & to return to ER if new symptoms develop including change in vision, purulent drainage, or entrapment.         Final Clinical Impression(s) / ED Diagnoses Final diagnoses:  Abrasion of left cornea, initial encounter    Rx / DC Orders ED Discharge Orders     None         Tama Fails, PA-C 04/30/23 1025    Deatra Face, MD 04/30/23 1204

## 2023-04-30 NOTE — Discharge Instructions (Signed)
 Tetracaine 1% 1 drop q30min has been found to be safe in the first 24 hrs Please read the attached care instructions: Contact a health care provider if: You keep having eye pain and other symptoms for more than 2-3 days. You have new symptoms, such as more redness, tearing, or fluid (discharge) coming from your eye. You have swollen eyelids. Your vision gets much worse. You have discharge that makes your eyelids stick together in the morning. Your eye patch becomes so loose that you can blink your eye. Symptoms come back after your abrasion heals. Get help right away if: You have severe eye pain that does not get better with medicine. You have severe vision loss.

## 2023-04-30 NOTE — ED Triage Notes (Signed)
 Patient presents after completing yard work this morning. Reports injury to L eye from tree branch. No obvious foreign body. Blurred vision. Denies use of corrective lenses.

## 2023-05-24 ENCOUNTER — Other Ambulatory Visit: Payer: Self-pay | Admitting: Cardiology

## 2023-05-25 ENCOUNTER — Other Ambulatory Visit: Payer: Self-pay | Admitting: Cardiology

## 2023-05-31 ENCOUNTER — Other Ambulatory Visit: Payer: Self-pay | Admitting: Cardiology

## 2023-06-02 ENCOUNTER — Encounter: Payer: Self-pay | Admitting: Cardiology

## 2023-06-02 MED ORDER — DILTIAZEM HCL ER COATED BEADS 120 MG PO CP24
120.0000 mg | ORAL_CAPSULE | Freq: Every day | ORAL | 1 refills | Status: DC
Start: 2023-06-02 — End: 2023-11-28

## 2023-06-10 ENCOUNTER — Encounter (HOSPITAL_BASED_OUTPATIENT_CLINIC_OR_DEPARTMENT_OTHER): Payer: Self-pay

## 2023-06-10 ENCOUNTER — Other Ambulatory Visit: Payer: Self-pay

## 2023-06-10 ENCOUNTER — Emergency Department (HOSPITAL_BASED_OUTPATIENT_CLINIC_OR_DEPARTMENT_OTHER)
Admission: EM | Admit: 2023-06-10 | Discharge: 2023-06-10 | Disposition: A | Discharge status: Home / Self Care | Attending: Emergency Medicine | Admitting: Emergency Medicine

## 2023-06-10 DIAGNOSIS — Z23 Encounter for immunization: Secondary | ICD-10-CM | POA: Diagnosis not present

## 2023-06-10 DIAGNOSIS — Z203 Contact with and (suspected) exposure to rabies: Secondary | ICD-10-CM | POA: Diagnosis present

## 2023-06-10 DIAGNOSIS — Z2914 Encounter for prophylactic rabies immune globin: Secondary | ICD-10-CM | POA: Diagnosis not present

## 2023-06-10 MED ORDER — RABIES IMMUNE GLOBULIN 300 UNIT/2ML IJ SOLN
20.0000 [IU]/kg | Freq: Once | INTRAMUSCULAR | Status: AC
  Administered 2023-06-10: 1200 [IU]
  Filled 2023-06-10: qty 8

## 2023-06-10 MED ORDER — RABIES VACCINE, PCEC IM SUSR
1.0000 mL | Freq: Once | INTRAMUSCULAR | Status: AC
Start: 2023-06-10 — End: 2023-06-10
  Administered 2023-06-10: 1 mL via INTRAMUSCULAR
  Filled 2023-06-10: qty 1

## 2023-06-10 NOTE — ED Provider Notes (Signed)
 [place] EMERGENCY DEPARTMENT AT [facility] Provider Note   CSN: [id number] Arrival date & time: 06/10/23  8295     History  Chief Complaint  Patient presents with   Cynthia Nguyen Exposure    Cynthia Nguyen is a 53 y.o. female.  Patient presents after a bat exposure.  This occurred while sleeping 2 days ago.  Patient has not had any wounds noted.  Her and her family member were reading about recommendations and they came in to see if the rabies series was recommended.  Of note, they will be traveling on a cruise in 1 week.       Home Medications Prior to Admission medications   Medication Sig Start Date End Date Taking? Authorizing Provider  atenolol  (TENORMIN ) 50 MG tablet TAKE ONE TABLET BY MOUTH ONCE DAILY 05/24/23   Eilleen Grates, MD  Biotin w/ Vitamins C & E (HAIR/SKIN/NAILS PO) Take by mouth daily. Patient not taking: Reported on 11/14/2022    [provider]  calcium carbonate (OS-CAL) 600 MG TABS tablet Take 600 mg by mouth 2 (two) times daily with a meal.  Patient not taking: Reported on 11/14/2022    [provider]  Cholecalciferol (VITAMIN D3) 50 MCG (2000 UT) TABS Take 1 tablet by mouth daily. Patient not taking: Reported on 11/14/2022    [provider]  diltiazem  (CARDIZEM  CD) 120 MG 24 hr capsule Take 1 capsule (120 mg total) by mouth daily. 06/02/23   Eilleen Grates, MD  estradiol (VIVELLE-DOT) 0.1 MG/24HR patch Place 1 patch onto the skin 2 (two) times a week. 03/10/20   [provider]  Magnesium 250 MG TABS Take 250 mg by mouth daily. Patient not taking: Reported on 11/14/2022    [provider]  MELATONIN PO Take 3 mg by mouth at bedtime.    [provider]  NON FORMULARY Uria Forty nail gel- Apply once daily nightly    [provider]  progesterone (PROMETRIUM) 100 MG capsule Take 1 capsule by mouth daily. Taking 200 mg    [provider]      Allergies    Cephalexin    Review  of Systems   Review of Systems  Physical Exam Updated Vital Signs BP 103/89 (BP Location: Right Arm)   Pulse 70   Temp 97.8 F (36.6 C) (Oral)   Resp 18   Ht 5\' 4"  (1.626 m)   Wt 65.8 kg   SpO2 100%   BMI 24.89 kg/m   Physical Exam Vitals and nursing note reviewed.  Constitutional:      Appearance: She is well-developed.  HENT:     Head: Normocephalic and atraumatic.  Eyes:     Conjunctiva/sclera: Conjunctivae normal.  Pulmonary:     Effort: No respiratory distress.  Musculoskeletal:     Cervical back: Normal range of motion and neck supple.  Skin:    General: Skin is dry.  Neurological:     Mental Status: She is alert.     ED Results / Procedures / Treatments   Labs (all labs ordered are listed, but only abnormal results are displayed) Labs Reviewed - No data to display  EKG None  Radiology No results found.  Procedures Procedures    Medications Ordered in ED Medications  rabies immune globulin (HYPERRAB) injection 1,200 Units (has no administration in time range)  rabies vaccine (RABAVERT) injection 1 mL (has no administration in time range)    ED Course/ Medical Decision Making/ A&P    Patient  seen and examined. History obtained directly from patient.   Labs/EKG: None ordered  Imaging: None ordered  Medications/Fluids: Rabies vaccine and IgG  Most recent vital signs reviewed and are as follows: BP 103/89 (BP Location: Right Arm)   Pulse 70   Temp 97.8 F (36.6 C) (Oral)   Resp 18   Ht 5\' 4"  (1.626 m)   Wt 65.8 kg   SpO2 100%   BMI 24.89 kg/m   Initial impression: Bat exposure, need for rabies series  We discussed that while this is a low risk exposure, sometimes bite wounds are not readily apparent.  After discussion, they would like to initiate series.  Home treatment plan: Monitoring for symptoms  Return instructions discussed with patient: Return with any concerns  Follow-up instructions discussed with patient: Rabies  vaccine schedule                                Medical Decision Making Risk Prescription drug management.   Asymptomatic patient here for rabies series.        Final Clinical Impression(s) / ED Diagnoses Final diagnoses:  Need for post exposure prophylaxis for rabies    Rx / DC Orders ED Discharge Orders     None         Lyna Sandhoff, PA-C 06/10/23 1046    Lowery Rue, DO 06/10/23 1336

## 2023-06-10 NOTE — ED Triage Notes (Signed)
 Bat exposure Thursday night. Was in her room while she was sleeping.

## 2023-06-13 ENCOUNTER — Ambulatory Visit (HOSPITAL_COMMUNITY)
Admission: EM | Admit: 2023-06-13 | Discharge: 2023-06-13 | Disposition: A | Discharge status: Home / Self Care | Attending: Family Medicine | Admitting: Family Medicine

## 2023-06-13 ENCOUNTER — Encounter (HOSPITAL_COMMUNITY): Payer: Self-pay

## 2023-06-13 DIAGNOSIS — Z23 Encounter for immunization: Secondary | ICD-10-CM | POA: Diagnosis not present

## 2023-06-13 DIAGNOSIS — Z203 Contact with and (suspected) exposure to rabies: Secondary | ICD-10-CM | POA: Diagnosis not present

## 2023-06-13 MED ORDER — RABIES VACCINE, PCEC IM SUSR
INTRAMUSCULAR | Status: AC
  Filled 2023-06-13: qty 1

## 2023-06-13 MED ORDER — RABIES VACCINE, PCEC IM SUSR
1.0000 mL | Freq: Once | INTRAMUSCULAR | Status: AC
  Administered 2023-06-13: 1 mL via INTRAMUSCULAR

## 2023-06-13 NOTE — ED Triage Notes (Addendum)
 Patient here today to have 2nd Rabies Vaccine. Patient is doing well. Patient states that she will be coming in on Thursday for her Day 7 vaccine due to going on a cruise the following day.

## 2023-06-15 ENCOUNTER — Ambulatory Visit (HOSPITAL_COMMUNITY)
Admission: EM | Admit: 2023-06-15 | Discharge: 2023-06-15 | Disposition: A | Discharge status: Home / Self Care | Attending: Family Medicine | Admitting: Family Medicine

## 2023-06-15 DIAGNOSIS — Z23 Encounter for immunization: Secondary | ICD-10-CM | POA: Diagnosis not present

## 2023-06-15 DIAGNOSIS — Z203 Contact with and (suspected) exposure to rabies: Secondary | ICD-10-CM

## 2023-06-15 MED ORDER — RABIES VACCINE, PCEC IM SUSR
INTRAMUSCULAR | Status: AC
  Filled 2023-06-15: qty 1

## 2023-06-15 MED ORDER — RABIES VACCINE, PCEC IM SUSR
1.0000 mL | Freq: Once | INTRAMUSCULAR | Status: AC
  Administered 2023-06-15: 1 mL via INTRAMUSCULAR

## 2023-06-15 NOTE — ED Notes (Signed)
 Patient presenting for day 7 rabies vaccine. Denies any previous reactions to the other vaccines. Given in the right deltoid and tolerated well.

## 2023-06-25 ENCOUNTER — Encounter (HOSPITAL_COMMUNITY): Payer: Self-pay

## 2023-06-25 ENCOUNTER — Ambulatory Visit (HOSPITAL_COMMUNITY)
Admission: RE | Admit: 2023-06-25 | Discharge: 2023-06-25 | Disposition: A | Discharge status: Home / Self Care | Source: Ambulatory Visit | Attending: Family Medicine | Admitting: Family Medicine

## 2023-06-25 DIAGNOSIS — Z203 Contact with and (suspected) exposure to rabies: Secondary | ICD-10-CM

## 2023-06-25 DIAGNOSIS — Z23 Encounter for immunization: Secondary | ICD-10-CM

## 2023-06-25 MED ORDER — RABIES VACCINE, PCEC IM SUSR
INTRAMUSCULAR | Status: AC
Start: 2023-06-25 — End: 2023-06-25
  Filled 2023-06-25: qty 1

## 2023-06-25 MED ORDER — RABIES VACCINE, PCEC IM SUSR
1.0000 mL | Freq: Once | INTRAMUSCULAR | Status: AC
  Administered 2023-06-25: 1 mL via INTRAMUSCULAR

## 2023-06-25 NOTE — ED Triage Notes (Signed)
 Patient here today for day 14 rabies vaccine  on day 15 due to going on a cruise. Patient is doing well.

## 2023-08-01 ENCOUNTER — Telehealth: Payer: Self-pay

## 2023-08-01 NOTE — Telephone Encounter (Signed)
   Pre-operative Risk Assessment    Patient Name: Cynthia Nguyen  DOB: July 30, 1970 MRN: [id number]   Date of last office visit: 11/14/22 LYNWOOD SCHILLING, MD Date of next office visit: NONE   Request for Surgical Clearance    Procedure:  COLONOSCOPY  Date of Surgery:  Clearance 08/28/23                                Surgeon:  DR KRISTIE Surgeon's Group or Practice Name:  [facility] Phone number:  [phone] Fax number:  [phone]   Type of Clearance Requested:   - Medical    Type of Anesthesia:  PROPOFOL   Additional requests/questions:    Signed, Lucie DELENA Ku   08/01/2023, 2:57 PM

## 2023-08-01 NOTE — Telephone Encounter (Signed)
 Left msg for pt to call back

## 2023-08-01 NOTE — Telephone Encounter (Signed)
   Name: Cynthia Nguyen  DOB: 1970/11/12  MRN: [id number]  Primary Cardiologist: Lynwood Schilling, MD   Preoperative team, please contact this patient and set up a phone call appointment for further preoperative risk assessment. Please obtain consent and complete medication review. Thank you for your help.  I confirm that guidance regarding antiplatelet and oral anticoagulation therapy has been completed and, if necessary, noted below.  None requested.   I also confirmed the patient resides in the state of [place] . As per [facility] telemedicine laws, the patient must reside in the state in which the provider is licensed.    Barnie Hila, NP 08/01/2023, 4:22 PM [facility]

## 2023-08-02 ENCOUNTER — Telehealth: Payer: Self-pay | Admitting: *Deleted

## 2023-08-02 NOTE — Telephone Encounter (Signed)
 Pt has been scheduled tele preop appt 08/16/23. Pt said she will review meds during the tele appt.   Consent is done.      Patient Consent for Virtual Visit        Cynthia Nguyen has provided verbal consent on 08/02/2023 for a virtual visit (video or telephone).   CONSENT FOR VIRTUAL VISIT FOR:  Cynthia Nguyen  By participating in this virtual visit I agree to the following:  I hereby voluntarily request, consent and authorize [facility] and its employed or contracted physicians, physician assistants, nurse practitioners or other licensed health care professionals (the Practitioner), to provide me with telemedicine health care services (the "Services) as deemed necessary by the treating Practitioner. I acknowledge and consent to receive the Services by the Practitioner via telemedicine. I understand that the telemedicine visit will involve communicating with the Practitioner through live audiovisual communication technology and the disclosure of certain medical information by electronic transmission. I acknowledge that I have been given the opportunity to request an in-person assessment or other available alternative prior to the telemedicine visit and am voluntarily participating in the telemedicine visit.  I understand that I have the right to withhold or withdraw my consent to the use of telemedicine in the course of my care at any time, without affecting my right to future care or treatment, and that the Practitioner or I may terminate the telemedicine visit at any time. I understand that I have the right to inspect all information obtained and/or recorded in the course of the telemedicine visit and may receive copies of available information for a reasonable fee.  I understand that some of the potential risks of receiving the Services via telemedicine include:  Delay or interruption in medical evaluation due to technological equipment failure or disruption; Information transmitted  may not be sufficient (e.g. poor resolution of images) to allow for appropriate medical decision making by the Practitioner; and/or  In rare instances, security protocols could fail, causing a breach of personal health information.  Furthermore, I acknowledge that it is my responsibility to provide information about my medical history, conditions and care that is complete and accurate to the best of my ability. I acknowledge that Practitioner's advice, recommendations, and/or decision may be based on factors not within their control, such as incomplete or inaccurate data provided by me or distortions of diagnostic images or specimens that may result from electronic transmissions. I understand that the practice of medicine is not an exact science and that Practitioner makes no warranties or guarantees regarding treatment outcomes. I acknowledge that a copy of this consent can be made available to me via my patient portal [facility] MyChart), or I can request a printed copy by calling the office of [facility].    I understand that my insurance will be billed for this visit.   I have read or had this consent read to me. I understand the contents of this consent, which adequately explains the benefits and risks of the Services being provided via telemedicine.  I have been provided ample opportunity to ask questions regarding this consent and the Services and have had my questions answered to my satisfaction. I give my informed consent for the services to be provided through the use of telemedicine in my medical care

## 2023-08-02 NOTE — Telephone Encounter (Signed)
 Pt has been scheduled tele preop appt 08/16/23. Pt said she will review meds during the tele appt.   Consent is done.

## 2023-08-16 ENCOUNTER — Ambulatory Visit: Attending: Cardiovascular Disease

## 2023-08-16 DIAGNOSIS — Z01810 Encounter for preprocedural cardiovascular examination: Secondary | ICD-10-CM

## 2023-08-16 NOTE — Progress Notes (Signed)
 Virtual Visit via Telephone Note   Because of Cynthia Nguyen co-morbid illnesses, she is at least at moderate risk for complications without adequate follow up.  This format is felt to be most appropriate for this patient at this time.  Due to technical limitations with video connection (technology), today's appointment will be conducted as an audio only telehealth visit, and Cynthia Nguyen verbally agreed to proceed in this manner.   All issues noted in this document were discussed and addressed.  No physical exam could be performed with this format.  Evaluation Performed:  Preoperative cardiovascular risk assessment _____________   Date:  08/16/2023   Patient ID:  Cynthia Nguyen, DOB 24-Nov-1970, MRN [id number] Patient Location:  Home Provider location:   Office  Primary Care Provider:  Kip Righter, MD Primary Cardiologist:  Lynwood Schilling, MD  Chief Complaint / Patient Profile  53 y.o. y/o female with a h/o palpitations who is pending colonoscopy and presents today for telephonic preoperative cardiovascular risk assessment. History of Present Illness  Cynthia Nguyen is a 53 y.o. female who presents via audio/video conferencing for a telehealth visit today.  Pt was last seen in cardiology clinic on 11/14/2022 by Dr. Schilling.  At that time Cynthia Nguyen was doing well.  The patient is now pending procedure as outlined above.  On chart review in 01/2023 patient presented to the emergency room with increased dizziness, she was unsure if she had taken an extra dose of her medications for palpitations resulting in a low heart rate.  She was worked up in the ED and results were reassuring.  She was discharged home.  Today she reports that she has not had any further recurrence of symptoms that sent her to the ED and her palpitations have been well-controlled, she reports she still notices some on occasion however these are stable.  Today she denies chest pain, shortness of breath, lower  extremity edema, fatigue, melena, hematuria, hemoptysis, diaphoresis, weakness, presyncope, syncope, orthopnea, and PND. She is able to achieve greater than 4 METs of activity.  Past Medical History    Past Medical History:  Diagnosis Date   Fibrocystic breast disease    Generalized anxiety disorder    Mitral valve prolapse    childhood   Scoliosis    per pt >60% curvature   Seasonal allergies    Past Surgical History:  Procedure Laterality Date   BREAST CYST ASPIRATION  08/16/2010   BREAST CYST ASPIRATION  12/25/2009   BREAST CYST ASPIRATION  07/22/2009   WISDOM TOOTH EXTRACTION     Allergies Allergies  Allergen Reactions   Cephalexin     dizziness   Home Medications    Prior to Admission medications   Medication Sig Start Date End Date Taking? Authorizing Provider  atenolol  (TENORMIN ) 50 MG tablet TAKE ONE TABLET BY MOUTH ONCE DAILY 05/24/23   Schilling Lynwood, MD  diltiazem  (CARDIZEM  CD) 120 MG 24 hr capsule Take 1 capsule (120 mg total) by mouth daily. 06/02/23   Schilling Lynwood, MD  estradiol (VIVELLE-DOT) 0.1 MG/24HR patch Place 1 patch onto the skin 2 (two) times a week. 03/10/20   [provider]  NON FORMULARY Uria Forty nail gel- Apply once daily nightly    [provider]  progesterone (PROMETRIUM) 100 MG capsule Take 1 capsule by mouth daily. Taking 200 mg    [provider]    Physical Exam  Vital Signs:  Cynthia Nguyen does not have vital signs available for review today. Given telephonic nature  of communication, physical exam is limited. AAOx3. NAD. Normal affect.  Speech and respirations are unlabored. Accessory Clinical Findings   None Assessment & Plan    1.  Preoperative Cardiovascular Risk Assessment: [name]'s perioperative risk of a major cardiac event is 0.4% according to the Revised Cardiac Risk Index (RCRI).  Therefore, she is at low risk for perioperative complications.   Her functional capacity is excellent at 9.89 METs  according to the Duke Activity Status Index (DASI). Recommendations: According to ACC/AHA guidelines, no further cardiovascular testing needed.  The patient may proceed to surgery at acceptable risk.   Antiplatelet and/or Anticoagulation Recommendations: None requested    The patient was advised that if she develops new symptoms prior to surgery to contact our office to arrange for a follow-up visit, and she verbalized understanding.  A copy of this note will be routed to requesting surgeon.  Time:   Today, I have spent 10 minutes with the patient with telehealth technology discussing medical history, symptoms, and management plan.    Cynthia Brocksmith D Jesenya Bowditch, NP  08/16/2023, 4:18 PM

## 2023-11-25 ENCOUNTER — Other Ambulatory Visit: Payer: Self-pay | Admitting: Cardiology

## 2023-12-24 ENCOUNTER — Other Ambulatory Visit: Payer: Self-pay | Admitting: Cardiology

## 2024-01-03 ENCOUNTER — Other Ambulatory Visit: Payer: Self-pay | Admitting: Obstetrics

## 2024-01-03 DIAGNOSIS — Z1231 Encounter for screening mammogram for malignant neoplasm of breast: Secondary | ICD-10-CM

## 2024-01-13 ENCOUNTER — Encounter: Payer: Self-pay | Admitting: Cardiology

## 2024-01-15 MED ORDER — ATENOLOL 50 MG PO TABS: 50.0000 mg | ORAL_TABLET | Freq: Every day | ORAL | 0 refills | Status: DC

## 2024-01-28 ENCOUNTER — Other Ambulatory Visit: Payer: Self-pay | Admitting: Cardiology

## 2024-02-01 ENCOUNTER — Ambulatory Visit: Admitting: Cardiology

## 2024-02-06 ENCOUNTER — Other Ambulatory Visit: Payer: Self-pay | Admitting: Cardiology

## 2024-02-08 NOTE — Telephone Encounter (Signed)
 Labs can be Completed at next F/U appt.

## 2024-02-09 ENCOUNTER — Ambulatory Visit: Admission: RE | Admit: 2024-02-09 | Source: Ambulatory Visit

## 2024-02-09 DIAGNOSIS — Z1231 Encounter for screening mammogram for malignant neoplasm of breast: Secondary | ICD-10-CM

## 2024-02-23 ENCOUNTER — Ambulatory Visit: Admitting: Cardiology
